# Patient Record
Sex: Female | Born: 1970 | Race: White | Hispanic: No | Marital: Married
Health system: Southern US, Community
[De-identification: ages and names within clinical notes are randomized; demographics above are authoritative.]

---

## 1999-03-18 ENCOUNTER — Other Ambulatory Visit: Admission: RE | Admit: 1999-03-18 | Discharge: 1999-03-18 | Payer: Self-pay | Admitting: Obstetrics and Gynecology

## 1999-07-16 ENCOUNTER — Encounter: Admission: RE | Admit: 1999-07-16 | Discharge: 1999-10-14 | Payer: Self-pay | Admitting: Obstetrics & Gynecology

## 1999-10-22 ENCOUNTER — Inpatient Hospital Stay (HOSPITAL_COMMUNITY): Admission: AD | Admit: 1999-10-22 | Discharge: 1999-10-24 | Payer: Self-pay | Admitting: Obstetrics & Gynecology

## 1999-10-24 ENCOUNTER — Ambulatory Visit: Admission: RE | Admit: 1999-10-24 | Discharge: 1999-10-24 | Payer: Self-pay | Admitting: Obstetrics and Gynecology

## 1999-11-26 ENCOUNTER — Other Ambulatory Visit: Admission: RE | Admit: 1999-11-26 | Discharge: 1999-11-26 | Payer: Self-pay | Admitting: Obstetrics and Gynecology

## 2017-09-30 ENCOUNTER — Emergency Department: Payer: Medicaid Other

## 2017-09-30 ENCOUNTER — Inpatient Hospital Stay
Admission: EM | Admit: 2017-09-30 | Discharge: 2017-10-07 | DRG: 853 | Disposition: A | Discharge status: Home Health | Payer: Medicaid Other | Attending: Internal Medicine | Admitting: Internal Medicine

## 2017-09-30 ENCOUNTER — Other Ambulatory Visit: Payer: Self-pay

## 2017-09-30 DIAGNOSIS — I890 Lymphedema, not elsewhere classified: Secondary | ICD-10-CM | POA: Diagnosis present

## 2017-09-30 DIAGNOSIS — F17210 Nicotine dependence, cigarettes, uncomplicated: Secondary | ICD-10-CM | POA: Diagnosis present

## 2017-09-30 DIAGNOSIS — L03115 Cellulitis of right lower limb: Secondary | ICD-10-CM | POA: Diagnosis present

## 2017-09-30 DIAGNOSIS — I872 Venous insufficiency (chronic) (peripheral): Secondary | ICD-10-CM | POA: Diagnosis present

## 2017-09-30 DIAGNOSIS — K117 Disturbances of salivary secretion: Secondary | ICD-10-CM | POA: Diagnosis present

## 2017-09-30 DIAGNOSIS — M868X7 Other osteomyelitis, ankle and foot: Secondary | ICD-10-CM | POA: Diagnosis present

## 2017-09-30 DIAGNOSIS — Z6841 Body Mass Index (BMI) 40.0 and over, adult: Secondary | ICD-10-CM | POA: Diagnosis not present

## 2017-09-30 DIAGNOSIS — K029 Dental caries, unspecified: Secondary | ICD-10-CM | POA: Diagnosis present

## 2017-09-30 DIAGNOSIS — I96 Gangrene, not elsewhere classified: Secondary | ICD-10-CM | POA: Diagnosis present

## 2017-09-30 DIAGNOSIS — K047 Periapical abscess without sinus: Secondary | ICD-10-CM | POA: Diagnosis not present

## 2017-09-30 DIAGNOSIS — I878 Other specified disorders of veins: Secondary | ICD-10-CM | POA: Diagnosis present

## 2017-09-30 DIAGNOSIS — R531 Weakness: Secondary | ICD-10-CM | POA: Diagnosis present

## 2017-09-30 DIAGNOSIS — D509 Iron deficiency anemia, unspecified: Secondary | ICD-10-CM | POA: Diagnosis present

## 2017-09-30 DIAGNOSIS — A419 Sepsis, unspecified organism: Principal | ICD-10-CM | POA: Diagnosis present

## 2017-09-30 DIAGNOSIS — L89614 Pressure ulcer of right heel, stage 4: Secondary | ICD-10-CM | POA: Diagnosis present

## 2017-09-30 DIAGNOSIS — R739 Hyperglycemia, unspecified: Secondary | ICD-10-CM | POA: Diagnosis present

## 2017-09-30 LAB — CBC WITH DIFFERENTIAL/PLATELET
Basophils Absolute: 0 10*3/uL (ref 0–0.1)
Basophils Relative: 0 %
EOS ABS: 0.1 10*3/uL (ref 0–0.7)
EOS PCT: 0 %
HCT: 30.2 % — ABNORMAL LOW (ref 35.0–47.0)
Hemoglobin: 9.5 g/dL — ABNORMAL LOW (ref 12.0–16.0)
LYMPHS ABS: 1.6 10*3/uL (ref 1.0–3.6)
Lymphocytes Relative: 11 %
MCH: 17.2 pg — AB (ref 26.0–34.0)
MCHC: 31.5 g/dL — ABNORMAL LOW (ref 32.0–36.0)
MCV: 54.7 fL — ABNORMAL LOW (ref 80.0–100.0)
MONO ABS: 0.7 10*3/uL (ref 0.2–0.9)
Monocytes Relative: 5 %
Neutro Abs: 11.4 10*3/uL — ABNORMAL HIGH (ref 1.4–6.5)
Neutrophils Relative %: 84 %
PLATELETS: 289 10*3/uL (ref 150–440)
RBC: 5.51 MIL/uL — AB (ref 3.80–5.20)
RDW: 21.5 % — AB (ref 11.5–14.5)
WBC: 13.8 10*3/uL — ABNORMAL HIGH (ref 3.6–11.0)

## 2017-09-30 LAB — COMPREHENSIVE METABOLIC PANEL
ALT: 35 U/L (ref 14–54)
ANION GAP: 9 (ref 5–15)
AST: 45 U/L — ABNORMAL HIGH (ref 15–41)
Albumin: 2.7 g/dL — ABNORMAL LOW (ref 3.5–5.0)
Alkaline Phosphatase: 197 U/L — ABNORMAL HIGH (ref 38–126)
BUN: 22 mg/dL — ABNORMAL HIGH (ref 6–20)
CHLORIDE: 98 mmol/L — AB (ref 101–111)
CO2: 28 mmol/L (ref 22–32)
Calcium: 9.6 mg/dL (ref 8.9–10.3)
Creatinine, Ser: 0.81 mg/dL (ref 0.44–1.00)
Glucose, Bld: 144 mg/dL — ABNORMAL HIGH (ref 65–99)
POTASSIUM: 4 mmol/L (ref 3.5–5.1)
SODIUM: 135 mmol/L (ref 135–145)
Total Bilirubin: 0.3 mg/dL (ref 0.3–1.2)
Total Protein: 8.6 g/dL — ABNORMAL HIGH (ref 6.5–8.1)

## 2017-09-30 LAB — LACTIC ACID, PLASMA
LACTIC ACID, VENOUS: 2.1 mmol/L — AB (ref 0.5–1.9)
LACTIC ACID, VENOUS: 0.9 mmol/L (ref 0.5–1.9)

## 2017-09-30 LAB — TSH: TSH: 1.375 u[IU]/mL (ref 0.350–4.500)

## 2017-09-30 LAB — GLUCOSE, CAPILLARY: Glucose-Capillary: 133 mg/dL — ABNORMAL HIGH (ref 65–99)

## 2017-09-30 MED ORDER — ACETAMINOPHEN 650 MG RE SUPP: 650.0000 mg | Freq: Four times a day (QID) | RECTAL | Status: DC | PRN

## 2017-09-30 MED ORDER — ACETAMINOPHEN 325 MG PO TABS
650.0000 mg | ORAL_TABLET | Freq: Four times a day (QID) | ORAL | Status: DC | PRN
  Filled 2017-09-30: qty 2

## 2017-09-30 MED ORDER — BISACODYL 10 MG RE SUPP: 10.0000 mg | Freq: Every day | RECTAL | Status: DC | PRN

## 2017-09-30 MED ORDER — VANCOMYCIN HCL 10 G IV SOLR
1500.0000 mg | Freq: Three times a day (TID) | INTRAVENOUS | Status: DC
  Administered 2017-09-30 – 2017-10-01 (×2): 1500 mg via INTRAVENOUS
  Filled 2017-09-30 (×4): qty 1500

## 2017-09-30 MED ORDER — VANCOMYCIN HCL IN DEXTROSE 1-5 GM/200ML-% IV SOLN
1000.0000 mg | Freq: Once | INTRAVENOUS | Status: AC
  Administered 2017-09-30: 1000 mg via INTRAVENOUS
  Filled 2017-09-30: qty 200

## 2017-09-30 MED ORDER — INSULIN ASPART 100 UNIT/ML ~~LOC~~ SOLN
0.0000 [IU] | Freq: Three times a day (TID) | SUBCUTANEOUS | Status: DC
  Administered 2017-10-02 – 2017-10-03 (×2): 1 [IU] via SUBCUTANEOUS
  Filled 2017-09-30 (×3): qty 1

## 2017-09-30 MED ORDER — PIPERACILLIN-TAZOBACTAM 3.375 G IVPB 30 MIN
3.3750 g | Freq: Once | INTRAVENOUS | Status: AC
  Administered 2017-09-30: 3.375 g via INTRAVENOUS
  Filled 2017-09-30: qty 50

## 2017-09-30 MED ORDER — SODIUM CHLORIDE 0.9 % IV SOLN
Freq: Once | INTRAVENOUS | Status: AC
  Administered 2017-09-30: 21:00:00 via INTRAVENOUS

## 2017-09-30 MED ORDER — PIPERACILLIN-TAZOBACTAM 3.375 G IVPB
3.3750 g | Freq: Three times a day (TID) | INTRAVENOUS | Status: DC
  Administered 2017-10-01 – 2017-10-05 (×13): 3.375 g via INTRAVENOUS
  Filled 2017-09-30 (×13): qty 50

## 2017-09-30 MED ORDER — TRAMADOL HCL 50 MG PO TABS
50.0000 mg | ORAL_TABLET | Freq: Four times a day (QID) | ORAL | Status: DC | PRN
  Administered 2017-09-30: 50 mg via ORAL
  Filled 2017-09-30: qty 1

## 2017-09-30 MED ORDER — POLYETHYLENE GLYCOL 3350 17 G PO PACK: 17.0000 g | PACK | Freq: Every day | ORAL | Status: DC | PRN

## 2017-09-30 MED ORDER — ENOXAPARIN SODIUM 40 MG/0.4ML ~~LOC~~ SOLN
40.0000 mg | Freq: Two times a day (BID) | SUBCUTANEOUS | Status: DC
  Administered 2017-09-30 – 2017-10-06 (×12): 40 mg via SUBCUTANEOUS
  Filled 2017-09-30 (×12): qty 0.4

## 2017-09-30 MED ORDER — SODIUM CHLORIDE 0.9 % IV BOLUS (SEPSIS)
1000.0000 mL | Freq: Once | INTRAVENOUS | Status: AC
  Administered 2017-09-30: 1000 mL via INTRAVENOUS

## 2017-09-30 NOTE — ED Notes (Signed)
Dr. Lenard LancePaduchowski made aware of critical lactic value of 2.1

## 2017-09-30 NOTE — ED Provider Notes (Signed)
[facility] Emergency Department Provider Note  Time seen: 5:14 PM  I have reviewed the triage vital signs and the nursing notes.   HISTORY  Chief Complaint Leg Pain    HPI Charlene Jackson is a 47 y.o. female with a past medical history of stasis dermatitis, who presents to the emergency department for worsening right foot pain, worsening lower extremity swelling.  According to the patient it has been approximate 1 year since she is followed up with any physician for her stasis dermatitis is no longer taking any medications.  Patient states at baseline she has swollen legs with plaque/brown skin that accumulates on her feet.  Patient states she often times will get in the bath and is able to remove the excess skin.  She states for the past 1 week she has not been able to do that due to progressively worsening pain especially in the right foot.  Today she noted pus and blood coming from the right foot which is what prompted the ER visit.  Patient states she has not been able to ambulate for the past 2 days at all due to pain especially in the right foot.  Patient denies any known fever, largely negative review of systems otherwise.  Past medical history: With stasis dermatitis  Medications: None  Allergies: None  Social History Social History   Tobacco Use  . Smoking status: Not on file  Substance Use Topics  . Alcohol use: Not on file  . Drug use: Not on file    Review of Systems Constitutional: Negative for fever. Eyes: Negative for visual complaints ENT: Negative for congestion Cardiovascular: Negative for chest pain. Respiratory: Negative for shortness of breath. Gastrointestinal: Negative for abdominal pain, vomiting  Genitourinary: Negative for dysuria.  Musculoskeletal: Lower extremity swelling bilateral pain Skin: Increased pain swelling plaques to bilateral feet Neurological: Negative for headache All other ROS  negative  ____________________________________________   PHYSICAL EXAM:  VITAL SIGNS: ED Triage Vitals  Enc Vitals Group     BP 09/30/17 1632 118/75     Pulse Rate 09/30/17 1632 93     Resp 09/30/17 1632 16     Temp --      Temp src --      SpO2 09/30/17 1632 98 %     Weight 09/30/17 1633 280 lb (127 kg)     Height 09/30/17 1633 5\' 7"  (1.702 m)     Head Circumference --      Peak Flow --      Pain Score 09/30/17 1632 10     Pain Loc --      Pain Edu? --      Excl. in GC? --    Constitutional: Alert and oriented. Well appearing and in no distress. Eyes: Normal exam ENT   Head: Normocephalic and atraumatic.   Mouth/Throat: Mucous membranes are moist. Cardiovascular: Normal rate, regular rhythm. No murmur Respiratory: Normal respiratory effort without tachypnea nor retractions. Breath sounds are clear Gastrointestinal: Soft and nontender. No distention.  Musculoskeletal: 3+ lower extremity edema equal bilaterally.  Patient has impressive skin/plaque buildup to bilateral feet.  On her right heel she has a large area/wound with slight blood/pus discharge.  Appears consistent with a necrotic wound. Neurologic:  Normal speech and language. No gross focal neurologic deficits  Skin:  Skin is warm, swelling, redness, pus and blood drainage from right foot.  Impressive excessive skin/plaque buildup to bilateral feet. Psychiatric: Mood and affect are normal.  ____________________________________________  RADIOLOGY  x-rays are negative for osteomyelitis.  ____________________________________________   INITIAL IMPRESSION / ASSESSMENT AND PLAN / ED COURSE  Pertinent labs & imaging results that were available during my care of the patient were reviewed by me and considered in my medical decision making (see chart for details).  Presents to the emergency department for bilateral foot pain right greater than left now with pus and blood drainage.  Differential would include  infection, ulceration, necrotic wound, cellulitis, osteomyelitis.  We will check labs, cultures, start broad-spectrum antibiotics.  Will obtain x-ray imaging to rule out gas forming organisms.  Patient will require admission to the hospital for further treatment.  Records reviewed, no old records available at this time  labs show an elevated lactic acid as well as white blood cell count. X-rays are negative. Given the elevated white blood cell count and initial elevated heart rate and activated sepsis protocols. Patient is already receiving broad-spectrum antibiotics. Will admit to the hospitalist service for further treatment.   ____________________________________________   FINAL CLINICAL IMPRESSION(S) / ED DIAGNOSES  Cellulitis    Minna Antis, MD 09/30/17 1910

## 2017-09-30 NOTE — H&P (Addendum)
[facility] - [place] at [facility]   PATIENT NAME: Charlene Jackson    MR#:  [id number]  DATE OF BIRTH:  [birth year]  DATE OF ADMISSION:  09/30/2017  PRIMARY CARE PHYSICIAN: Patient, No Pcp Per   REQUESTING/REFERRING PHYSICIAN: Dr. Lenard Lance  CHIEF COMPLAINT:  Bilateral severe lower extremity infection with drainage from both feet foul-smelling for several months HISTORY OF PRESENT ILLNESS:  Pearlie Nies  is a 47 y.o. female with past medical history of stasis dermatitis both lower extremity presents to the emergency room department for worsening right foot pain and worsening bilateral lower extremity swelling with fungating skin infection along with significant amount of drainage from the right heel.  Patient says at baseline her legs have been swollen with black/brown scaly skin that he relates on her feet falls off on its own.  She used to follow-up with a dermatologist however has lost to do so in many months. She states for the past 1 week she has not been able to do so progressively worsening pain in the right foot.  She noted pus and blood coming from the right heel which prompted her to come to the ER.  She does not take any medications. She has elevated white count lactic acid 2.1.  Given severe infection she is started on IV vancomycin and Zosyn.  Patient is being admitted for further vaginal management with sepsis secondary to severe cellulitis of the setting of severe stasis dermatitis both lower extremities.  PAST MEDICAL HISTORY:  Stasis dermatitis  PAST SURGICAL HISTOIRY:  none  SOCIAL HISTORY:   Social History   Tobacco Use  . Smoking status: Not on file  Substance Use Topics  . Alcohol use: Not on file    FAMILY HISTORY:  No family history on file.  DRUG ALLERGIES:  Not on File  REVIEW OF SYSTEMS:  Review of Systems  Constitutional: Negative for chills, fever and weight loss.  HENT: Negative for ear discharge, ear pain and  nosebleeds.   Eyes: Negative for blurred vision, pain and discharge.  Respiratory: Negative for sputum production, shortness of breath, wheezing and stridor.   Cardiovascular: Negative for chest pain, palpitations, orthopnea and PND.  Gastrointestinal: Negative for abdominal pain, diarrhea, nausea and vomiting.  Genitourinary: Negative for frequency and urgency.  Musculoskeletal: Positive for joint pain. Negative for back pain.  Skin:       Severe stasis dermatitis and severe cellulitis with fungating lesions in both lower feet  Neurological: Positive for weakness. Negative for sensory change, speech change and focal weakness.  Psychiatric/Behavioral: Negative for depression and hallucinations. The patient is not nervous/anxious.      MEDICATIONS AT HOME:   Prior to Admission medications   Not on File      VITAL SIGNS:  Blood pressure (!) 117/59, pulse 87, resp. rate 16, height 5\' 7"  (1.702 m), weight 127 kg (280 lb), SpO2 99 %.  PHYSICAL EXAMINATION:  GENERAL:  47 y.o.-year-old patient lying in the bed with no acute distress.  Obese EYES: Pupils equal, round, reactive to light and accommodation. No scleral icterus. Extraocular muscles intact.  HEENT: Head atraumatic, normocephalic. Oropharynx and nasopharynx clear.  NECK:  Supple, no jugular venous distention. No thyroid enlargement, no tenderness.  LUNGS: Normal breath sounds bilaterally, no wheezing, rales,rhonchi or crepitation. No use of accessory muscles of respiration.  CARDIOVASCULAR: S1, S2 normal. No murmurs, rubs, or gallops.  ABDOMEN: Soft, nontender, nondistended. Bowel sounds present. No organomegaly or mass.  EXTREMITIES: Skin is warm there  is swelling redness and pus with blood drainage from the right foot.  She has impressive excessive skin/plaque buildup to bilateral feet and up to the mid tibial sin NEUROLOGIC: Grossly intact unable to assess secondary to patient's severe body posture. pSYCHIATRIC: The patient is  alert and oriented x 3.  SKIN: No obvious rash, lesion, or ulcer.   LABORATORY PANEL:   CBC Recent Labs  Lab 09/30/17 1658  WBC 13.8*  HGB 9.5*  HCT 30.2*  PLT 289   ------------------------------------------------------------------------------------------------------------------  Chemistries  Recent Labs  Lab 09/30/17 1658  NA 135  K 4.0  CL 98*  CO2 28  GLUCOSE 144*  BUN 22*  CREATININE 0.81  CALCIUM 9.6  AST 45*  ALT 35  ALKPHOS 197*  BILITOT 0.3   ------------------------------------------------------------------------------------------------------------------  Cardiac Enzymes No results for input(s): TROPONINI in the last 168 hours. ------------------------------------------------------------------------------------------------------------------  RADIOLOGY:  Dg Ankle Complete Left  Result Date: 09/30/2017 CLINICAL DATA:  Bilateral leg pain progressively worsened over the past few months. Stasis dermatitis. Open laceration to bottom of right foot. EXAM: LEFT ANKLE COMPLETE - 3+ VIEW COMPARISON:  None. FINDINGS: Evaluation of osseous detail is limited by body habitus. Bone mineralization is grossly normal. Alignment appears normal. No acute or suspicious osseous lesion identified. No focal soft tissue abnormality seen. IMPRESSION: No acute findings.  Study limitations detailed above. Electronically Signed   By: Bary Richard M.D.   On: 09/30/2017 18:05   Dg Ankle Complete Right  Result Date: 09/30/2017 CLINICAL DATA:  Bilateral leg pain that has progressively worsened over the past few months. Pt reports she has stasis dermatitis. Pt has an open laceration to the bottom right foot. EXAM: RIGHT ANKLE - COMPLETE 3+ VIEW COMPARISON:  None. FINDINGS: Evaluation of osseous detail is limited by patient body habitus. Bone mineralization is grossly normal. Osseous alignment appears normal. No acute or suspicious osseous lesions seen. No focal soft tissue abnormality identified.  Subtle lucency underlying the calcaneus may be related to the given history of laceration. IMPRESSION: No acute osseous abnormality identified. Study limitations detailed above. Lucency underlying the calcaneus may related to the given history of laceration. Electronically Signed   By: Bary Richard M.D.   On: 09/30/2017 18:06    EKG:    IMPRESSION AND PLAN:   Christella App  is a 47 y.o. female with past medical history of stasis dermatitis both lower extremity presents to the emergency room department for worsening right foot pain and worsening bilateral lower extremity swelling with fungating skin infection along with significant amount of drainage from the right heel.  Patient says at baseline her legs have been swollen with black/brown scaly skin that he relates on her feet falls off on its own.  She used to follow-up with a dermatologist however has lost to do so in many months.  1.  Sepsis secondary to bilateral lower extremity cellulitis with significant wound infection foul-smelling right foot more than the left -Patient has severe amount of drainage from the right heel. -Admit to medical floor -IV Vanco and Zosyn -Podiaty, surgery and vascular consultation -Wound consult -X-ray both feet no evidence of osteomyelitis noted on plain x-ray film  2.  Hyperglycemia Check A1c Sliding scale insulin  3.  Patient states she has history of thyroid problem -We will check TSH  4.  DVT prophylaxis subcu Lovenox  All the records are reviewed and case discussed with ED provider. Management plans discussed with the patient, family and they are in agreement.  CODE STATUS:  full  TOTAL TIME TAKING CARE OF THIS PATIENT: 45 minutes.    Enedina FinnerSona Zeanna Sunde M.D on 09/30/2017 at 7:26 PM  Between 7am to 6pm - Pager - [phone]  After 6pm go to [email or web address] - password [email or web address] [facility]  [facility]  Office  [phone]  CC: Primary care physician; Patient, No Pcp Per

## 2017-09-30 NOTE — Progress Notes (Signed)
Pharmacy Antibiotic Note  Charlene PiesLisa R Jackson is a 47 y.o. female admitted on 09/30/2017 with wound infection.  Pharmacy has been consulted for vancomycin and zosyn dosing.  Plan: Vancomycin 1500mg  IV every 8 hours.  Goal trough 15-20 mcg/mL. Zosyn 3.375g IV q8h (4 hour infusion).  Height: 5\' 7"  (170.2 cm) Weight: 280 lb (127 kg) IBW/kg (Calculated) : 61.6  No data recorded.  Recent Labs  Lab 09/30/17 1658 09/30/17 1732  WBC 13.8*  --   CREATININE 0.81  --   LATICACIDVEN  --  2.1*    Estimated Creatinine Clearance: 117.7 mL/min (by C-G formula based on SCr of 0.81 mg/dL).    Not on File  Antimicrobials this admission: Anti-infectives (From admission, onward)   Start     Dose/Rate Route Frequency Ordered Stop   10/01/17 0400  piperacillin-tazobactam (ZOSYN) IVPB 3.375 g     3.375 g 12.5 mL/hr over 240 Minutes Intravenous Every 8 hours 09/30/17 1933     09/30/17 2200  vancomycin (VANCOCIN) 1,500 mg in sodium chloride 0.9 % 500 mL IVPB     1,500 mg 250 mL/hr over 120 Minutes Intravenous Every 8 hours 09/30/17 1939     09/30/17 1715  vancomycin (VANCOCIN) IVPB 1000 mg/200 mL premix     1,000 mg 200 mL/hr over 60 Minutes Intravenous  Once 09/30/17 1714 09/30/17 1905   09/30/17 1715  piperacillin-tazobactam (ZOSYN) IVPB 3.375 g     3.375 g 100 mL/hr over 30 Minutes Intravenous  Once 09/30/17 1714 09/30/17 1825      Microbiology results: No results found for this or any previous visit (from the past 240 hour(s)).  Thank you for allowing pharmacy to be a part of this patient's care.  Charlene Jackson 09/30/2017 7:40 PM

## 2017-09-30 NOTE — ED Triage Notes (Signed)
Pt arrived via EMS from home d/t leg pain tat has progressively worsened over the past few months. Pt reports she has stasis dermatitis. Pt has an open laceration to the bottom right foot. Pt denies any other medical hx. Pt is A&O x 4 at this time.

## 2017-09-30 NOTE — Consult Note (Signed)
Reason for Consult: Ulceration right heel Referring Physician: [name] is an 47 y.o. female.  HPI: This is a 47 year old female with a complaint of chronic stasis dermatitis in both of her legs. She had been seen last year by dermatology who recommended follow-up with her primary care which was never established. Recently about a month ago noticed a sore on her right heel that has progressed. He is noticed some increased drainage recently and presented to the emergency department for evaluation.  History reviewed. No pertinent past medical history.  History reviewed. No pertinent surgical history.  History reviewed. No pertinent family history.  Social History:  reports that she quit smoking yesterday. she has never used smokeless tobacco. Her alcohol and drug histories are not on file.  Allergies: Not on File  Medications:  Scheduled: . enoxaparin (LOVENOX) injection  40 mg Subcutaneous Q12H  . [START ON 10/01/2017] insulin aspart  0-9 Units Subcutaneous TID WC    Results for orders placed or performed during the hospital encounter of 09/30/17 (from the past 48 hour(s))  CBC with Differential     Status: Abnormal   Collection Time: 09/30/17  4:58 PM  Result Value Ref Range   WBC 13.8 (H) 3.6 - 11.0 K/uL   RBC 5.51 (H) 3.80 - 5.20 MIL/uL   Hemoglobin 9.5 (L) 12.0 - 16.0 g/dL   HCT 30.2 (L) 35.0 - 47.0 %   MCV 54.7 (L) 80.0 - 100.0 fL   MCH 17.2 (L) 26.0 - 34.0 pg   MCHC 31.5 (L) 32.0 - 36.0 g/dL   RDW 21.5 (H) 11.5 - 14.5 %   Platelets 289 150 - 440 K/uL   Neutrophils Relative % 84 %   Neutro Abs 11.4 (H) 1.4 - 6.5 K/uL   Lymphocytes Relative 11 %   Lymphs Abs 1.6 1.0 - 3.6 K/uL   Monocytes Relative 5 %   Monocytes Absolute 0.7 0.2 - 0.9 K/uL   Eosinophils Relative 0 %   Eosinophils Absolute 0.1 0 - 0.7 K/uL   Basophils Relative 0 %   Basophils Absolute 0.0 0 - 0.1 K/uL    Comment: Performed at [facility], [address] [place], [place]  [address]  Comprehensive metabolic panel     Status: Abnormal   Collection Time: 09/30/17  4:58 PM  Result Value Ref Range   Sodium 135 135 - 145 mmol/L   Potassium 4.0 3.5 - 5.1 mmol/L   Chloride 98 (L) 101 - 111 mmol/L   CO2 28 22 - 32 mmol/L   Glucose, Bld 144 (H) 65 - 99 mg/dL   BUN 22 (H) 6 - 20 mg/dL   Creatinine, Ser 0.81 0.44 - 1.00 mg/dL   Calcium 9.6 8.9 - 10.3 mg/dL   Total Protein 8.6 (H) 6.5 - 8.1 g/dL   Albumin 2.7 (L) 3.5 - 5.0 g/dL   AST 45 (H) 15 - 41 U/L   ALT 35 14 - 54 U/L   Alkaline Phosphatase 197 (H) 38 - 126 U/L   Total Bilirubin 0.3 0.3 - 1.2 mg/dL   GFR calc non Af Amer >60 >60 mL/min   GFR calc Af Amer >60 >60 mL/min    Comment: (NOTE) The eGFR has been calculated using the CKD EPI equation. This calculation has not been validated in all clinical situations. eGFR's persistently <60 mL/min signify possible Chronic Kidney Disease.    Anion gap 9 5 - 15    Comment: Performed at [facility], [address] [place], [place] [address]  TSH     Status: None   Collection Time: 09/30/17  4:58 PM  Result Value Ref Range   TSH 1.375 0.350 - 4.500 uIU/mL    Comment: Performed by a 3rd Generation assay with a functional sensitivity of <=0.01 uIU/mL. Performed at [facility], [address] [place], [place] [address]   Lactic acid, plasma     Status: Abnormal   Collection Time: 09/30/17  5:32 PM  Result Value Ref Range   Lactic Acid, Venous 2.1 (HH) 0.5 - 1.9 mmol/L    Comment: CRITICAL RESULT CALLED TO, READ BACK BY AND VERIFIED WITH DAJEA SCOTT AT 1817 09/30/2017 BY TFK. Performed at [facility], [address] [place], [place] [address]   Lactic acid, plasma     Status: None   Collection Time: 09/30/17  9:50 PM  Result Value Ref Range   Lactic Acid, Venous 0.9 0.5 - 1.9 mmol/L    Comment: Performed at [facility], [address] [place],  [address]    Dg Ankle Complete Left  Result Date:  09/30/2017 CLINICAL DATA:  Bilateral leg pain progressively worsened over the past few months. Stasis dermatitis. Open laceration to bottom of right foot. EXAM: LEFT ANKLE COMPLETE - 3+ VIEW COMPARISON:  None. FINDINGS: Evaluation of osseous detail is limited by body habitus. Bone mineralization is grossly normal. Alignment appears normal. No acute or suspicious osseous lesion identified. No focal soft tissue abnormality seen. IMPRESSION: No acute findings.  Study limitations detailed above. Electronically Signed   By: Franki Cabot M.D.   On: 09/30/2017 18:05   Dg Ankle Complete Right  Result Date: 09/30/2017 CLINICAL DATA:  Bilateral leg pain that has progressively worsened over the past few months. Pt reports she has stasis dermatitis. Pt has an open laceration to the bottom right foot. EXAM: RIGHT ANKLE - COMPLETE 3+ VIEW COMPARISON:  None. FINDINGS: Evaluation of osseous detail is limited by patient body habitus. Bone mineralization is grossly normal. Osseous alignment appears normal. No acute or suspicious osseous lesions seen. No focal soft tissue abnormality identified. Subtle lucency underlying the calcaneus may be related to the given history of laceration. IMPRESSION: No acute osseous abnormality identified. Study limitations detailed above. Lucency underlying the calcaneus may related to the given history of laceration. Electronically Signed   By: Franki Cabot M.D.   On: 09/30/2017 18:06    Review of Systems  Constitutional: Negative for chills and fever.  HENT: Negative.   Eyes: Negative.   Respiratory: Negative.   Cardiovascular: Positive for leg swelling.  Gastrointestinal: Negative for nausea and vomiting.  Genitourinary: Negative.   Musculoskeletal:       Relates pain in the right leg with difficulty moving the leg  Skin:       The patient relates chronic stasis dermatitis with both legs. Relates a sore on the bottom of her right heel for the past month with some recent increased  drainage.  Neurological: Negative.   Endo/Heme/Allergies: Negative.   Psychiatric/Behavioral: Negative.    Blood pressure 122/60, pulse 95, resp. rate 20, height 5' 7"  (1.702 m), weight 127 kg (280 lb), SpO2 97 %. Physical Exam  Cardiovascular:  DP and PT pulses could not clearly be palpated but this may be related to edema.  Musculoskeletal:  Difficulty trying to perform range of motion in the pedal joints. Significant pain on any attempted motion in the right foot with severe pain around the heel.  Neurological:  Protective threshold monofilament wire appears to be grossly intact and symmetric to the digits bilateral.  Skin:  Severe edema with fungated stasis dermatitis bilateral. Full-thickness ulceration with boggy appearance on the plantar aspect of the right heel measuring at least 5 x 7 cm with undetermined depth. Bone.    Assessment/Plan: Assessment: Full-thickness ulceration right heel with gangrenous changes  Plan: Excisional debridement of devitalized tissue superficially from the ulceration on the right heel as well as a small area of full thickness including some of the deeper subcutaneous tissue using a pair of tissue nippers. A wet to dry dressing was applied to the right heel. Discussed with the patient that she will need surgical debridement of the devitalized infected tissue. We will also wait to hear from vascular surgery as far as timing for debridement as she may also need some type of vascular evaluation. Due to her condition and the location of the ulceration we did discuss that there would be an extended recovery time involved and that if the ulceration does progressed down to the bone that there is a risk for lower extremity amputation. I would like to try to obtain an MRI of the right ankle for evaluation of the extent of infection and possible bone involvement. Plan for this tomorrow and then try to coordinate as far as debridement in the near future.  Durward Fortes 09/30/2017, 11:05 PM

## 2017-09-30 NOTE — Progress Notes (Signed)
Anticoagulation monitoring(Lovenox):  47 yo female ordered Lovenox 40 mg Q24h  Filed Weights   09/30/17 1633  Weight: 280 lb (127 kg)   BMI 43.8    Lab Results  Component Value Date   CREATININE 0.81 09/30/2017   Estimated Creatinine Clearance: 117.7 mL/min (by C-G formula based on SCr of 0.81 mg/dL). Hemoglobin & Hematocrit     Component Value Date/Time   HGB 9.5 (L) 09/30/2017 1658   HCT 30.2 (L) 09/30/2017 1658     Per Protocol for Patient with estCrcl > 30 ml/min and BMI > 40, will transition to Lovenox 40 mg Q12h.

## 2017-10-01 ENCOUNTER — Encounter
Admission: EM | Disposition: A | Discharge status: Home Health | Payer: Self-pay | Source: Home / Self Care | Attending: Internal Medicine

## 2017-10-01 ENCOUNTER — Inpatient Hospital Stay: Payer: Medicaid Other

## 2017-10-01 DIAGNOSIS — M79604 Pain in right leg: Secondary | ICD-10-CM

## 2017-10-01 DIAGNOSIS — R600 Localized edema: Secondary | ICD-10-CM

## 2017-10-01 DIAGNOSIS — F17200 Nicotine dependence, unspecified, uncomplicated: Secondary | ICD-10-CM

## 2017-10-01 DIAGNOSIS — L89619 Pressure ulcer of right heel, unspecified stage: Secondary | ICD-10-CM

## 2017-10-01 HISTORY — PX: LOWER EXTREMITY ANGIOGRAPHY: CATH118251

## 2017-10-01 LAB — URINALYSIS, ROUTINE W REFLEX MICROSCOPIC
BILIRUBIN URINE: NEGATIVE
Bacteria, UA: NONE SEEN
GLUCOSE, UA: NEGATIVE mg/dL
Ketones, ur: NEGATIVE mg/dL
NITRITE: NEGATIVE
PH: 5 (ref 5.0–8.0)
Protein, ur: NEGATIVE mg/dL
SPECIFIC GRAVITY, URINE: 1.018 (ref 1.005–1.030)

## 2017-10-01 LAB — BLOOD CULTURE ID PANEL (REFLEXED)
Acinetobacter baumannii: NOT DETECTED
CANDIDA GLABRATA: NOT DETECTED
CANDIDA KRUSEI: NOT DETECTED
CANDIDA PARAPSILOSIS: NOT DETECTED
CANDIDA TROPICALIS: NOT DETECTED
Candida albicans: NOT DETECTED
ENTEROBACTER CLOACAE COMPLEX: NOT DETECTED
ENTEROBACTERIACEAE SPECIES: NOT DETECTED
Enterococcus species: NOT DETECTED
Escherichia coli: NOT DETECTED
Haemophilus influenzae: NOT DETECTED
KLEBSIELLA OXYTOCA: NOT DETECTED
KLEBSIELLA PNEUMONIAE: NOT DETECTED
Listeria monocytogenes: NOT DETECTED
Methicillin resistance: NOT DETECTED
Neisseria meningitidis: NOT DETECTED
Proteus species: NOT DETECTED
Pseudomonas aeruginosa: NOT DETECTED
STREPTOCOCCUS PYOGENES: NOT DETECTED
Serratia marcescens: NOT DETECTED
Staphylococcus aureus (BCID): NOT DETECTED
Staphylococcus species: DETECTED — AB
Streptococcus agalactiae: NOT DETECTED
Streptococcus pneumoniae: NOT DETECTED
Streptococcus species: NOT DETECTED

## 2017-10-01 LAB — GLUCOSE, CAPILLARY
GLUCOSE-CAPILLARY: 92 mg/dL (ref 65–99)
Glucose-Capillary: 90 mg/dL (ref 65–99)
Glucose-Capillary: 78 mg/dL (ref 65–99)
Glucose-Capillary: 85 mg/dL (ref 65–99)
Glucose-Capillary: 119 mg/dL — ABNORMAL HIGH (ref 65–99)

## 2017-10-01 LAB — HEMOGLOBIN A1C
HEMOGLOBIN A1C: 5.4 % (ref 4.8–5.6)
Mean Plasma Glucose: 108.28 mg/dL

## 2017-10-01 SURGERY — LOWER EXTREMITY ANGIOGRAPHY
Anesthesia: Moderate Sedation | Laterality: Right

## 2017-10-01 SURGERY — LOWER EXTREMITY ANGIOGRAPHY
Anesthesia: Moderate Sedation

## 2017-10-01 MED ORDER — FENTANYL CITRATE (PF) 100 MCG/2ML IJ SOLN
INTRAMUSCULAR | Status: AC
  Filled 2017-10-01: qty 2

## 2017-10-01 MED ORDER — FENTANYL CITRATE (PF) 100 MCG/2ML IJ SOLN
INTRAMUSCULAR | Status: DC | PRN
  Administered 2017-10-01 (×2): 50 ug via INTRAVENOUS

## 2017-10-01 MED ORDER — HEPARIN (PORCINE) IN NACL 2-0.9 UNIT/ML-% IJ SOLN
INTRAMUSCULAR | Status: AC
  Filled 2017-10-01: qty 1000

## 2017-10-01 MED ORDER — IOPAMIDOL (ISOVUE-300) INJECTION 61%
INTRAVENOUS | Status: DC | PRN
  Administered 2017-10-01: 30 mL via INTRAVENOUS

## 2017-10-01 MED ORDER — HEPARIN SODIUM (PORCINE) 1000 UNIT/ML IJ SOLN
INTRAMUSCULAR | Status: AC
  Filled 2017-10-01: qty 1

## 2017-10-01 MED ORDER — MIDAZOLAM HCL 2 MG/2ML IJ SOLN
INTRAMUSCULAR | Status: AC
  Filled 2017-10-01: qty 2

## 2017-10-01 MED ORDER — MIDAZOLAM HCL 2 MG/2ML IJ SOLN
INTRAMUSCULAR | Status: DC | PRN
  Administered 2017-10-01 (×2): 2 mg via INTRAVENOUS

## 2017-10-01 MED ORDER — MIDAZOLAM HCL 5 MG/5ML IJ SOLN
INTRAMUSCULAR | Status: AC
  Filled 2017-10-01: qty 5

## 2017-10-01 MED ORDER — SODIUM CHLORIDE 0.9 % IV SOLN
INTRAVENOUS | Status: DC
  Administered 2017-10-01: 15:00:00 via INTRAVENOUS

## 2017-10-01 MED ORDER — CEFUROXIME SODIUM 1.5 G IV SOLR
1.5000 g | INTRAVENOUS | Status: DC
  Filled 2017-10-01: qty 1.5

## 2017-10-01 MED ORDER — LIDOCAINE-EPINEPHRINE (PF) 1 %-1:200000 IJ SOLN
INTRAMUSCULAR | Status: AC
  Filled 2017-10-01: qty 30

## 2017-10-01 MED ORDER — VANCOMYCIN HCL IN DEXTROSE 1-5 GM/200ML-% IV SOLN
1000.0000 mg | Freq: Three times a day (TID) | INTRAVENOUS | Status: DC
  Administered 2017-10-01 – 2017-10-02 (×4): 1000 mg via INTRAVENOUS
  Filled 2017-10-01 (×8): qty 200

## 2017-10-01 SURGICAL SUPPLY — 7 items
CATH PIG 70CM (CATHETERS) ×2 IMPLANT
COVER PROBE U/S 5X48 (MISCELLANEOUS) ×2 IMPLANT
DEVICE STARCLOSE SE CLOSURE (Vascular Products) ×2 IMPLANT
PACK ANGIOGRAPHY (CUSTOM PROCEDURE TRAY) ×3 IMPLANT
SHEATH BRITE TIP 5FRX11 (SHEATH) ×2 IMPLANT
TUBING CONTRAST HIGH PRESS 72 (TUBING) ×2 IMPLANT
WIRE J 3MM .035X145CM (WIRE) ×2 IMPLANT

## 2017-10-01 NOTE — Progress Notes (Signed)
[facility] [place] [facility]   PATIENT NAME: Charlene Jackson    MR#:  [id number]  DATE OF BIRTH:  09-Dec-1970  SUBJECTIVE:  CHIEF COMPLAINT:   Chief Complaint  Patient presents with  . Leg Pain   Have severe lymphedema and bilateral leg swelling, came with worsening pain and redness with some discharge from right heel. Found to have osteomyelitis on MRI.  REVIEW OF SYSTEMS:  CONSTITUTIONAL: No fever, positive for fatigue or weakness.  EYES: No blurred or double vision.  EARS, NOSE, AND THROAT: No tinnitus or ear pain.  RESPIRATORY: No cough, shortness of breath, wheezing or hemoptysis.  CARDIOVASCULAR: No chest pain, orthopnea, edema.  GASTROINTESTINAL: No nausea, vomiting, diarrhea or abdominal pain.  GENITOURINARY: No dysuria, hematuria.  ENDOCRINE: No polyuria, nocturia,  HEMATOLOGY: No anemia, easy bruising or bleeding SKIN: Chronic skin changes due to lymphedema and venous stasis on both legs. MUSCULOSKELETAL: No joint pain or arthritis.   NEUROLOGIC: No tingling, numbness, weakness.  PSYCHIATRY: No anxiety or depression.   ROS  DRUG ALLERGIES:  Not on File  VITALS:  Blood pressure (!) 110/50, pulse 92, temperature (!) 96 F (35.6 C), temperature source Rectal, resp. rate 16, height 5\' 7"  (1.702 m), weight 127 kg (280 lb), last menstrual period 10/01/2017, SpO2 99 %.  PHYSICAL EXAMINATION:   GENERAL:  47 y.o.-year-old patient lying in the bed with no acute distress.  Obese EYES: Pupils equal, round, reactive to light and accommodation. No scleral icterus. Extraocular muscles intact.  HEENT: Head atraumatic, normocephalic. Oropharynx and nasopharynx clear.  NECK:  Supple, no jugular venous distention. No thyroid enlargement, no tenderness.  LUNGS: Normal breath sounds bilaterally, no wheezing, rales,rhonchi or crepitation. No use of accessory muscles of respiration.  CARDIOVASCULAR: S1, S2 normal. No murmurs, rubs, or gallops.  ABDOMEN: Soft,  nontender, nondistended. Bowel sounds present. No organomegaly or mass.  EXTREMITIES: Skin is warm there is swelling redness and pus with blood drainage from the right foot.  She has impressive excessive skin/plaque buildup to bilateral feet and up to the mid tibial shin, chronic changes of lymphedema and venous stasis on both legs. NEUROLOGIC: Grossly intact unable to assess secondary to patient's severe body posture. pSYCHIATRIC: The patient is alert and oriented x 3.  SKIN: No obvious rash, lesion, or ulcer.    Physical Exam LABORATORY PANEL:   CBC Recent Labs  Lab 09/30/17 1658  WBC 13.8*  HGB 9.5*  HCT 30.2*  PLT 289   ------------------------------------------------------------------------------------------------------------------  Chemistries  Recent Labs  Lab 09/30/17 1658  NA 135  K 4.0  CL 98*  CO2 28  GLUCOSE 144*  BUN 22*  CREATININE 0.81  CALCIUM 9.6  AST 45*  ALT 35  ALKPHOS 197*  BILITOT 0.3   ------------------------------------------------------------------------------------------------------------------  Cardiac Enzymes No results for input(s): TROPONINI in the last 168 hours. ------------------------------------------------------------------------------------------------------------------  RADIOLOGY:  Dg Ankle Complete Left  Result Date: 09/30/2017 CLINICAL DATA:  Bilateral leg pain progressively worsened over the past few months. Stasis dermatitis. Open laceration to bottom of right foot. EXAM: LEFT ANKLE COMPLETE - 3+ VIEW COMPARISON:  None. FINDINGS: Evaluation of osseous detail is limited by body habitus. Bone mineralization is grossly normal. Alignment appears normal. No acute or suspicious osseous lesion identified. No focal soft tissue abnormality seen. IMPRESSION: No acute findings.  Study limitations detailed above. Electronically Signed   By: Bary Richard M.D.   On: 09/30/2017 18:05   Dg Ankle Complete Right  Result Date:  09/30/2017 CLINICAL DATA:  Bilateral leg pain that has progressively worsened over the past few months. Pt reports she has stasis dermatitis. Pt has an open laceration to the bottom right foot. EXAM: RIGHT ANKLE - COMPLETE 3+ VIEW COMPARISON:  None. FINDINGS: Evaluation of osseous detail is limited by patient body habitus. Bone mineralization is grossly normal. Osseous alignment appears normal. No acute or suspicious osseous lesions seen. No focal soft tissue abnormality identified. Subtle lucency underlying the calcaneus may be related to the given history of laceration. IMPRESSION: No acute osseous abnormality identified. Study limitations detailed above. Lucency underlying the calcaneus may related to the given history of laceration. Electronically Signed   By: Bary RichardStan  Maynard M.D.   On: 09/30/2017 18:06   Mr Ankle Right Wo Contrast  Result Date: 10/01/2017 CLINICAL DATA:  History of chronic stasis dermatitis. The patient developed a sore on the right heel 1 month ago with pain and drainage. EXAM: MRI OF THE RIGHT ANKLE WITHOUT CONTRAST TECHNIQUE: Multiplanar, multisequence MR imaging of the ankle was performed. No intravenous contrast was administered. COMPARISON:  None. FINDINGS: TENDONS Peroneal: Intact. Posteromedial: Intact. Anterior: Intact. Achilles: Intact. Plantar Fascia: Thickening and edema are seen in the proximal 2 cm of both the medial and lateral cords. The plantar fascia is intact. LIGAMENTS Lateral: Intact. Medial: Intact. CARTILAGE Ankle Joint: Negative. Subtalar Joints/Sinus Tarsi: Negative. Bones: There is intense marrow edema in the dorsal 4 cm of the calcaneus which is worst subjacent to a skin ulceration on the heel. No fracture is identified. T1 and T2 hypointense line in the posterior, inferior calcaneus likely represents a growth arrest line or physeal plate. Other: Intense subcutaneous edema is present about the imaged lower leg, ankle and foot. A skin wound is seen on the plantar  surface of the foot. Deep and medial to the wound, there is focal fluid measuring 2.1 cm AP by 1.1 cm craniocaudal by 2.0 cm craniocaudal compatible with phlegmon or abscess. 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 Focal fluid collection deep and medial to the skin wound is consistent with phlegmon or abscess. Edema in the proximal 2 cm of the medial and lateral cords of the plantar fascia is worrisome for infection. Intense subcutaneous edema diffusely about the imaged lower leg, ankle and foot is consistent with dependent change and/or cellulitis. Negative for septic joint. Electronically Signed   By: Drusilla Kannerhomas  Dalessio M.D.   On: 10/01/2017 08:52    ASSESSMENT AND PLAN:   Active Problems:   Sepsis (HCC)  Army ChacoLisa Willhite  is a 47 y.o. female with past medical history of stasis dermatitis both lower extremity presents to the emergency room department for worsening right foot pain and worsening bilateral lower extremity swelling with fungating skin infection along with significant amount of drainage from the right heel.  Patient says at baseline her legs have been swollen with black/brown scaly skin that he relates on her feet falls off on its own.  She used to follow-up with a dermatologist however has lost to do so in many months.  1.  Sepsis secondary to bilateral lower extremity cellulitis, right heel osteomyelitis  significant wound infection foul-smelling right foot more than the left -Patient has severe amount of drainage from the right heel.  -IV Vanco and Zosyn -Podiaty, and vascular consultation -Wound consult -X-ray both feet no evidence of osteomyelitis noted on plain x-ray film - Initial debridement done by podiatry, more today with some debridement of bone. - vascular to do angiogram  today,and plan for out pt lymphedema pump.  2.  Hyperglycemia Check A1c Sliding scale insulin  3.  Patient states she has history  of thyroid problem - normal TSH  4.  DVT prophylaxis subcu Lovenox    All the records are reviewed and case discussed with Care Management/Social Workerr. Management plans discussed with the patient, family and they are in agreement.  CODE STATUS: Full.  TOTAL TIME TAKING CARE OF THIS PATIENT: 35 minutes.    POSSIBLE D/C IN 1-2 DAYS, DEPENDING ON CLINICAL CONDITION.   Altamese Dilling M.D on 10/01/2017   Between 7am to 6pm - Pager - [phone]  After 6pm go to [email or web address] - password [email or web address]  [facility]  Office  [phone]  CC: Primary care physician; Patient, No Pcp Per  Note: This dictation was prepared with Dragon dictation along with smaller phrase technology. Any transcriptional errors that result from this process are unintentional.

## 2017-10-01 NOTE — H&P (View-Only) (Signed)
Subjective/Chief Complaint: Patient seen. Somewhat groggy.   Objective: Vital signs in last 24 hours: Temp:  [96 F (35.6 C)] 96 F (35.6 C) (01/03 0621) Pulse Rate:  [80-95] 92 (01/03 0621) Resp:  [16-21] 16 (01/03 0547) BP: (93-122)/(39-75) 110/50 (01/03 0621) SpO2:  [97 %-100 %] 99 % (01/03 0547) Weight:  [127 kg (280 lb)] 127 kg (280 lb) (01/02 2033) Last BM Date: 09/30/17  Intake/Output from previous day: 01/02 0701 - 01/03 0700 In: 2500 [P.O.:200; IV Piggyback:2300] Out: 1000 [Urine:1000] Intake/Output this shift: Total I/O In: 500 [IV Piggyback:500] Out: -   The bandages dry and intact on the right foot and heel. MRI did reveal soft tissue abscess on the right heel as well as evidence for osteomyelitis at the inferior posterior aspect of the heel bone.  Lab Results:  Recent Labs    09/30/17 1658  WBC 13.8*  HGB 9.5*  HCT 30.2*  PLT 289   BMET Recent Labs    09/30/17 1658  NA 135  K 4.0  CL 98*  CO2 28  GLUCOSE 144*  BUN 22*  CREATININE 0.81  CALCIUM 9.6   PT/INR No results for input(s): LABPROT, INR in the last 72 hours. ABG No results for input(s): PHART, HCO3 in the last 72 hours.  Invalid input(s): PCO2, PO2  Studies/Results: Dg Ankle Complete Left  Result Date: 09/30/2017 CLINICAL DATA:  Bilateral leg pain progressively worsened over the past few months. Stasis dermatitis. Open laceration to bottom of right foot. EXAM: LEFT ANKLE COMPLETE - 3+ VIEW COMPARISON:  None. FINDINGS: Evaluation of osseous detail is limited by body habitus. Bone mineralization is grossly normal. Alignment appears normal. No acute or suspicious osseous lesion identified. No focal soft tissue abnormality seen. IMPRESSION: No acute findings.  Study limitations detailed above. Electronically Signed   By: Bary RichardStan  Maynard M.D.   On: 09/30/2017 18:05   Dg Ankle Complete Right  Result Date: 09/30/2017 CLINICAL DATA:  Bilateral leg pain that has progressively worsened over  the past few months. Pt reports she has stasis dermatitis. Pt has an open laceration to the bottom right foot. EXAM: RIGHT ANKLE - COMPLETE 3+ VIEW COMPARISON:  None. FINDINGS: Evaluation of osseous detail is limited by patient body habitus. Bone mineralization is grossly normal. Osseous alignment appears normal. No acute or suspicious osseous lesions seen. No focal soft tissue abnormality identified. Subtle lucency underlying the calcaneus may be related to the given history of laceration. IMPRESSION: No acute osseous abnormality identified. Study limitations detailed above. Lucency underlying the calcaneus may related to the given history of laceration. Electronically Signed   By: Bary RichardStan  Maynard M.D.   On: 09/30/2017 18:06   Mr Ankle Right Wo Contrast  Result Date: 10/01/2017 CLINICAL DATA:  History of chronic stasis dermatitis. The patient developed a sore on the right heel 1 month ago with pain and drainage. EXAM: MRI OF THE RIGHT ANKLE WITHOUT CONTRAST TECHNIQUE: Multiplanar, multisequence MR imaging of the ankle was performed. No intravenous contrast was administered. COMPARISON:  None. FINDINGS: TENDONS Peroneal: Intact. Posteromedial: Intact. Anterior: Intact. Achilles: Intact. Plantar Fascia: Thickening and edema are seen in the proximal 2 cm of both the medial and lateral cords. The plantar fascia is intact. LIGAMENTS Lateral: Intact. Medial: Intact. CARTILAGE Ankle Joint: Negative. Subtalar Joints/Sinus Tarsi: Negative. Bones: There is intense marrow edema in the dorsal 4 cm of the calcaneus which is worst subjacent to a skin ulceration on the heel. No fracture is identified. T1 and T2 hypointense line in  the posterior, inferior calcaneus likely represents a growth arrest line or physeal plate. Other: Intense subcutaneous edema is present about the imaged lower leg, ankle and foot. A skin wound is seen on the plantar surface of the foot. Deep and medial to the wound, there is focal fluid measuring 2.1  cm AP by 1.1 cm craniocaudal by 2.0 cm craniocaudal compatible with phlegmon or abscess. 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 Focal fluid collection deep and medial to the skin wound is consistent with phlegmon or abscess. Edema in the proximal 2 cm of the medial and lateral cords of the plantar fascia is worrisome for infection. Intense subcutaneous edema diffusely about the imaged lower leg, ankle and foot is consistent with dependent change and/or cellulitis. Negative for septic joint. Electronically Signed   By: Drusilla Kanner M.D.   On: 10/01/2017 08:52    Anti-infectives: Anti-infectives (From admission, onward)   Start     Dose/Rate Route Frequency Ordered Stop   10/01/17 1400  vancomycin (VANCOCIN) IVPB 1000 mg/200 mL premix     1,000 mg 200 mL/hr over 60 Minutes Intravenous Every 8 hours 10/01/17 1047     10/01/17 1210  cefUROXime (ZINACEF) 1.5 g in dextrose 5 % 50 mL IVPB     1.5 g 100 mL/hr over 30 Minutes Intravenous 30 min pre-op 10/01/17 1210     10/01/17 0400  piperacillin-tazobactam (ZOSYN) IVPB 3.375 g     3.375 g 12.5 mL/hr over 240 Minutes Intravenous Every 8 hours 09/30/17 1933     09/30/17 2200  vancomycin (VANCOCIN) 1,500 mg in sodium chloride 0.9 % 500 mL IVPB  Status:  Discontinued     1,500 mg 250 mL/hr over 120 Minutes Intravenous Every 8 hours 09/30/17 1939 10/01/17 1044   09/30/17 1715  vancomycin (VANCOCIN) IVPB 1000 mg/200 mL premix     1,000 mg 200 mL/hr over 60 Minutes Intravenous  Once 09/30/17 1714 09/30/17 1905   09/30/17 1715  piperacillin-tazobactam (ZOSYN) IVPB 3.375 g     3.375 g 100 mL/hr over 30 Minutes Intravenous  Once 09/30/17 1714 09/30/17 1825      Assessment/Plan: s/p Procedure(s): IRRIGATION AND DEBRIDEMENT FOOT (Right) Assessment: Ulceration with abscess and osteomyelitis right heel   Plan: Discussed with the patient the need for debridement of the soft tissue and  the bone in her right heel. Discussed possible risks and complications of the procedure including inability to heal due to continued infection and/or circulation which may require further debridements. Also discussed that she is at significant risk for lower extremity amputation. Questions were invited and answered. Patient elects to proceed with the debridement for later on tonight and we will obtain a consent for debridement of the soft tissues and bone. Plan for surgery later this evening  LOS: 1 day    Ricci Barker 10/01/2017

## 2017-10-01 NOTE — Consult Note (Signed)
[facility] Vascular Consult Note  MRN : [id number]  Charlene Jackson is a 47 y.o. (Jan 25, 1971) female who presents with chief complaint of  Chief Complaint  Patient presents with  . Leg Pain  .  History of Present Illness: I am asked by Dr. Jacqualyn Posey and Dr. Alberteen Spindle to see the patient for non-healing ulcer of the right leg/heel.  This has been present for about a month.  She does not know of any clear inciting event although this is clearly a pressure spot on a very immobile, morbidly obese patient.  She says this is quite painful.  This is associated with marked leg swelling but she has had this leg swelling from years in both legs.  Surrounding erythema.  Dr. Alberteen Spindle performed a bedside debridement but a full surgical debridement is still going to be necessary.  She has insulin requiring hyperglycemia here in the hospital among other medical comorbidities impairing wound healing.  She had not gotten a primary care physician and followed up over time and had not been getting medical care for a year or more.  She denies any previous vascular surgery or intervention to her knowledge.  No left leg ulcerations currently although marked swelling and stasis dermatitis changes are present bilaterally.  Current Facility-Administered Medications  Medication Dose Route Frequency Provider Last Rate Last Dose  . acetaminophen (TYLENOL) tablet 650 mg  650 mg Oral Q6H PRN Enedina Finner, MD       Or  . acetaminophen (TYLENOL) suppository 650 mg  650 mg Rectal Q6H PRN Enedina Finner, MD      . bisacodyl (DULCOLAX) suppository 10 mg  10 mg Rectal Daily PRN Enedina Finner, MD      . enoxaparin (LOVENOX) injection 40 mg  40 mg Subcutaneous Q12H Enedina Finner, MD   40 mg at 10/01/17 4098  . insulin aspart (novoLOG) injection 0-9 Units  0-9 Units Subcutaneous TID WC Enedina Finner, MD      . piperacillin-tazobactam (ZOSYN) IVPB 3.375 g  3.375 g Intravenous Q8H Coffee, Gerre Pebbles, [facility]   Stopped at 10/01/17 1191   . polyethylene glycol (MIRALAX / GLYCOLAX) packet 17 g  17 g Oral Daily PRN Enedina Finner, MD      . traMADol Janean Sark) tablet 50 mg  50 mg Oral Q6H PRN Enedina Finner, MD   50 mg at 09/30/17 2205  . vancomycin (VANCOCIN) IVPB 1000 mg/200 mL premix  1,000 mg Intravenous Q8H Cindi Carbon, [facility]        Past Medical History Stasis dermatitis Morbid obesity Leg swelling Heel ulceration  Past Surgical History None  Social History Tobacco use until this admission No alcohol use No IV drug use No smokeless tobacco  Family History No bleeding disorders, clotting disorders, autoimmune diseases, or aneurysms  Allergies None known  REVIEW OF SYSTEMS (Negative unless checked)  Constitutional: [] Weight loss  [] Fever  [] Chills Cardiac: [] Chest pain   [] Chest pressure   [] Palpitations   [] Shortness of breath when laying flat   [] Shortness of breath at rest   [x] Shortness of breath with exertion. Vascular:  [] Pain in legs with walking   [] Pain in legs at rest   [] Pain in legs when laying flat   [] Claudication   [] Pain in feet when walking  [x] Pain in feet at rest  [x] Pain in feet when laying flat   [] History of DVT   [] Phlebitis   [x] Swelling in legs   [] Varicose veins   [x] Non-healing ulcers Pulmonary:   [] Uses home oxygen   []   Productive cough   [] Hemoptysis   [] Wheeze  [] COPD   [] Asthma Neurologic:  [] Dizziness  [] Blackouts   [] Seizures   [] History of stroke   [] History of TIA  [] Aphasia   [] Temporary blindness   [] Dysphagia   [] Weakness or numbness in arms   [] Weakness or numbness in legs Musculoskeletal:  [] Arthritis   [] Joint swelling   [] Joint pain   [] Low back pain Hematologic:  [] Easy bruising  [] Easy bleeding   [] Hypercoagulable state   [] Anemic  [] Hepatitis Gastrointestinal:  [] Blood in stool   [] Vomiting blood  [] Gastroesophageal reflux/heartburn   [] Difficulty swallowing. Genitourinary:  [] Chronic kidney disease   [] Difficult urination  [] Frequent urination  [] Burning with urination    [] Blood in urine Skin:  [] Rashes   [x] Ulcers   [x] Wounds Psychological:  [x] History of anxiety   []  History of major depression.  Physical Examination  Vitals:   09/30/17 2033 10/01/17 0107 10/01/17 0547 10/01/17 0621  BP: 122/60  (!) 93/39 (!) 110/50  Pulse: 95  80 92  Resp: 20  16   Temp:    (!) 96 F (35.6 C)  TempSrc:  Oral  Rectal  SpO2: 97%  99%   Weight: 127 kg (280 lb)     Height: 5\' 7"  (1.702 m)      Body mass index is 43.85 kg/m.  Gen:  WD/WN, NAD morbidly obese.  Head: [place]/AT, No temporalis wasting. Ear/Nose/Throat: Hearing grossly intact, nares w/o erythema or drainage, oropharynx w/o Erythema/Exudate Eyes: Sclera non-icteric, conjunctiva clear Neck: Trachea midline.  No JVD.  Pulmonary:  Good air movement, respirations not labored, equal bilaterally.  Cardiac: RRR, normal S1, S2. Vascular:  Vessel Right Left  Radial Palpable Palpable                          PT  not palpable  not palpable  DP  not palpable  not palpable    Musculoskeletal: Positive for significant lower extremity weakness.  extremities without ischemic changes.  3-4+ bilateral lower extremity swelling with marked stasis dermatitis changes and mild erythema in bilateral draining ulceration on the right heel with moderate  surrounding erythema Neurologic: Sensation grossly intact in extremities.  Symmetrical.  Speech is fluent. Motor exam as listed above. Psychiatric: Judgment intact, Mood & affect appropriate for pt's clinical situation. Dermatologic: Lower extremities as described above      CBC Lab Results  Component Value Date   WBC 13.8 (H) 09/30/2017   HGB 9.5 (L) 09/30/2017   HCT 30.2 (L) 09/30/2017   MCV 54.7 (L) 09/30/2017   PLT 289 09/30/2017    BMET    Component Value Date/Time   NA 135 09/30/2017 1658   K 4.0 09/30/2017 1658   CL 98 (L) 09/30/2017 1658   CO2 28 09/30/2017 1658   GLUCOSE 144 (H) 09/30/2017 1658   BUN 22 (H) 09/30/2017 1658   CREATININE 0.81  09/30/2017 1658   CALCIUM 9.6 09/30/2017 1658   GFRNONAA >60 09/30/2017 1658   GFRAA >60 09/30/2017 1658   Estimated Creatinine Clearance: 117.7 mL/min (by C-G formula based on SCr of 0.81 mg/dL).  COAG No results found for: INR, PROTIME  Radiology Dg Ankle Complete Left  Result Date: 09/30/2017 CLINICAL DATA:  Bilateral leg pain progressively worsened over the past few months. Stasis dermatitis. Open laceration to bottom of right foot. EXAM: LEFT ANKLE COMPLETE - 3+ VIEW COMPARISON:  None. FINDINGS: Evaluation of osseous detail is limited by body habitus. Bone mineralization is  grossly normal. Alignment appears normal. No acute or suspicious osseous lesion identified. No focal soft tissue abnormality seen. IMPRESSION: No acute findings.  Study limitations detailed above. Electronically Signed   By: Bary Richard M.D.   On: 09/30/2017 18:05   Dg Ankle Complete Right  Result Date: 09/30/2017 CLINICAL DATA:  Bilateral leg pain that has progressively worsened over the past few months. Pt reports she has stasis dermatitis. Pt has an open laceration to the bottom right foot. EXAM: RIGHT ANKLE - COMPLETE 3+ VIEW COMPARISON:  None. FINDINGS: Evaluation of osseous detail is limited by patient body habitus. Bone mineralization is grossly normal. Osseous alignment appears normal. No acute or suspicious osseous lesions seen. No focal soft tissue abnormality identified. Subtle lucency underlying the calcaneus may be related to the given history of laceration. IMPRESSION: No acute osseous abnormality identified. Study limitations detailed above. Lucency underlying the calcaneus may related to the given history of laceration. Electronically Signed   By: Bary Richard M.D.   On: 09/30/2017 18:06   Mr Ankle Right Wo Contrast  Result Date: 10/01/2017 CLINICAL DATA:  History of chronic stasis dermatitis. The patient developed a sore on the right heel 1 month ago with pain and drainage. EXAM: MRI OF THE RIGHT ANKLE  WITHOUT CONTRAST TECHNIQUE: Multiplanar, multisequence MR imaging of the ankle was performed. No intravenous contrast was administered. COMPARISON:  None. FINDINGS: TENDONS Peroneal: Intact. Posteromedial: Intact. Anterior: Intact. Achilles: Intact. Plantar Fascia: Thickening and edema are seen in the proximal 2 cm of both the medial and lateral cords. The plantar fascia is intact. LIGAMENTS Lateral: Intact. Medial: Intact. CARTILAGE Ankle Joint: Negative. Subtalar Joints/Sinus Tarsi: Negative. Bones: There is intense marrow edema in the dorsal 4 cm of the calcaneus which is worst subjacent to a skin ulceration on the heel. No fracture is identified. T1 and T2 hypointense line in the posterior, inferior calcaneus likely represents a growth arrest line or physeal plate. Other: Intense subcutaneous edema is present about the imaged lower leg, ankle and foot. A skin wound is seen on the plantar surface of the foot. Deep and medial to the wound, there is focal fluid measuring 2.1 cm AP by 1.1 cm craniocaudal by 2.0 cm craniocaudal compatible with phlegmon or abscess. 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 Focal fluid collection deep and medial to the skin wound is consistent with phlegmon or abscess. Edema in the proximal 2 cm of the medial and lateral cords of the plantar fascia is worrisome for infection. Intense subcutaneous edema diffusely about the imaged lower leg, ankle and foot is consistent with dependent change and/or cellulitis. Negative for septic joint. Electronically Signed   By: Drusilla Kanner M.D.   On: 10/01/2017 08:52      Assessment/Plan 1.  Right heel ulceration with infection.  Certainly at risk for arterial insufficiency and this may be the best so rapidly.  Unable to palpate reasonable pulses given her very large size and markedly swollen legs.  Angiogram with possible intervention was recommended and that will be  performed today.  Risks and benefits were discussed.  Patient understands this is clearly a critical and limb threatening situation 2.  Right lower extremity swelling.  Likely a combination of lymphedema, venous disease, and morbid obesity.  Even with elevation her legs are tremendously swollen which is making wound healing much more difficult.  Further evaluation and treatment as an outpatient with consideration for lymphedema pump, compression devices, and elevation as  well as weight loss would be recommended 3.  Hyperglycemia.  Likely undiagnosed diabetes.  Glucose control important and the progression of atherosclerosis as well as wound healing 4.  Tobacco use.  Clearly a risk factor for atherosclerotic disease.  Smoking cessation recommended.    Festus BarrenJason Dew, MD  10/01/2017 11:00 AM    This note was created with Dragon medical transcription system.  Any error is purely unintentional

## 2017-10-01 NOTE — Anesthesia Preprocedure Evaluation (Addendum)
Anesthesia Evaluation  Patient identified by MRN, date of birth, ID band Patient awake    Reviewed: Allergy & Precautions, H&P , NPO status , Patient's Chart, lab work & pertinent test results, reviewed documented beta blocker date and time   History of Anesthesia Complications Negative for: history of anesthetic complications  Airway Mallampati: III  TM Distance: >3 FB Neck ROM: full    Dental  (+) Dental Advidsory Given, Poor Dentition, Teeth Intact   Pulmonary neg pulmonary ROS, former smoker,           Cardiovascular Exercise Tolerance: Poor negative cardio ROS       Neuro/Psych negative neurological ROS  negative psych ROS   GI/Hepatic negative GI ROS, Neg liver ROS,   Endo/Other  diabetes, Poorly ControlledMorbid obesity  Renal/GU negative Renal ROS  negative genitourinary   Musculoskeletal   Abdominal   Peds  Hematology negative hematology ROS (+)   Anesthesia Other Findings History reviewed. No pertinent past medical history. History reviewed. No pertinent surgical history. BMI    Body Mass Index:  43.85 kg/m     Reproductive/Obstetrics negative OB ROS                            Anesthesia Physical Anesthesia Plan  ASA: III  Anesthesia Plan: General LMA   Post-op Pain Management:    Induction: Intravenous  PONV Risk Score and Plan: 4 or greater and Ondansetron and Dexamethasone  Airway Management Planned: LMA  Additional Equipment:   Intra-op Plan:   Post-operative Plan: Extubation in OR  Informed Consent: I have reviewed the patients History and Physical, chart, labs and discussed the procedure including the risks, benefits and alternatives for the proposed anesthesia with the patient or authorized representative who has indicated his/her understanding and acceptance.   Dental Advisory Given  Plan Discussed with: CRNA  Anesthesia Plan Comments:         Anesthesia Quick Evaluation

## 2017-10-01 NOTE — H&P (Signed)
[place] [facility] History & Physical Update  The patient was interviewed and re-examined.  The patient's previous History and Physical has been reviewed and is unchanged.  There is no change in the plan of care. We plan to proceed with the scheduled procedure.  Festus BarrenJason Julion Gatt, MD  10/01/2017, 2:51 PM

## 2017-10-01 NOTE — Op Note (Signed)
[place] [facility] Percutaneous Study/Intervention Procedural Note   Date of Surgery: 10/01/2017  Surgeon(s):Esker Dever   Assistants:none  Pre-operative Diagnosis: Right heel ulceration, nonpalpable pedal pulses  Post-operative diagnosis: Same  Procedure(s) Performed: 1. Ultrasound guidance for vascular access left femoral artery 2. Catheter placement into right SFA from left femoral approach 3. Aortogram and selective right lower extremity angiogram 4. StarClose closure device left femoral artery  EBL: 5 cc  Contrast: 30 cc  Fluoro Time: 1 minute  Moderate Conscious Sedation Time: approximately 15 minutes using 4 mg of Versed and 100 Mcg of Fentanyl  Indications: Patient is a 47 y.o.female with osteomyelitis and ulceration on the right heel. The patient has nonpalpable pedal pulses but given her significant swelling this could be deceiving.  Nonetheless, with the severe, limb threatening ulceration and infection angiogram is indicated for full evaluation and potential treatment. The patient is brought in for angiography for further evaluation and potential treatment. Risks and benefits are discussed and informed consent is obtained  Procedure: The patient was identified and appropriate procedural time out was performed. The patient was then placed supine on the table and prepped and draped in the usual sterile fashion.Moderate conscious sedation was administered during a face to face encounter with the patient throughout the procedure with my supervision of the RN administering medicines and monitoring the patient's vital signs, pulse oximetry, telemetry and mental status throughout from the start of the procedure until the patient was taken to the recovery room. Ultrasound was used to evaluate the left common femoral artery. It was patent . A digital ultrasound image was acquired. A Seldinger  needle was used to access the left common femoral artery under direct ultrasound guidance and a permanent image was performed. A 0.035 J wire was advanced without resistance and a 5Fr sheath was placed. Pigtail catheter was placed into the aorta and an AP aortogram was performed. This demonstrated normal renal arteries and normal aorta and iliac segments without significant stenosis. I then crossed the aortic bifurcation and advanced to the right femoral head.  After injection to evaluate the common femoral artery and femoral bifurcation, over the J-wire the pigtail catheter was advanced into the mid right superficial femoral artery for evaluation of the distal perfusion.  Selective right lower extremity angiogram was then performed. This demonstrated that there was a normal common femoral artery, profunda femoris artery, and superficial femoral artery which was continuous down to a normal popliteal artery without significant stenosis.  There was then a normal tibial trifurcation and three-vessel runoff distally without focal stenosis identified. The patient was found to have normal arterial perfusion and no intervention was required.  I elected to terminate the procedure. The sheath was removed and StarClose closure device was deployed in the left femoral artery with excellent hemostatic result. The patient was taken to the recovery room in stable condition having tolerated the procedure well.  Findings:  Aortogram: This demonstrated normal renal arteries and normal aorta and iliac segments without significant stenosis. Right lower Extremity:There was a normal right common femoral artery, profunda femoris artery, and superficial femoral artery which was continuous down to a normal popliteal artery without significant stenosis.  There was then a normal tibial trifurcation and three-vessel runoff distally without focal stenosis identified   Disposition: Patient was taken to the  recovery room in stable condition having tolerated the procedure well.  Complications: None  Festus BarrenJason Camrie Stock 10/01/2017 3:38 PM   This note was created with Dragon Medical transcription system. Any errors  in dictation are purely unintentional.

## 2017-10-01 NOTE — Progress Notes (Signed)
Pt lying flat in bed in NAD, pt's BG noted to to be 85 earlier, given apple juice.  Dr. Wyn Quakerew at bedside earlier to discuss procedure with pt.  Pt informed of aftercare instructions and plan of care for recovery and pt verbalizes understanding.  Pt denies any further needs at this time.

## 2017-10-01 NOTE — Progress Notes (Signed)
Pharmacy Antibiotic Note  Charlene Jackson is a 47 y.o. female admitted on 09/30/2017 with wound infection.  Pharmacy has been consulted for vancomycin and piperacillin/tazobactam dosing.  Plan: Continue piperacillin/tazobactam 3.375 g IV q8h EI  Patient has received several doses of current regimen of vancomycin 1500 mg IV q8h.  Will reduce dose based on kinetic calculations to vancomycin 1000 mg IV q8h and check VT at steady state. Goal VT 15-20 mcg/mL Patient is at risk of accumulation due to obesity  Kinetics: Using adjusted body weight = 87 kg, CrCl 100 mL/min Ke: 0.087 Half-life: 7.9 hrs Vd: 61 L Cmin (calculated) = 16 mcg/mL  Height: 5\' 7"  (170.2 cm) Weight: 280 lb (127 kg) IBW/kg (Calculated) : 61.6  Temp (24hrs), Avg:96 F (35.6 C), Min:96 F (35.6 C), Max:96 F (35.6 C)  Recent Labs  Lab 09/30/17 1658 09/30/17 1732 09/30/17 2150  WBC 13.8*  --   --   CREATININE 0.81  --   --   LATICACIDVEN  --  2.1* 0.9    Estimated Creatinine Clearance: 117.7 mL/min (by C-G formula based on SCr of 0.81 mg/dL).    Not on File  Micro:  1/2 Blood: No growth < 24 hours 1/3 Urine: Sent  Thank you for allowing pharmacy to be a part of this patient's care.  Charlene Jackson, PharmD, BCPS Clinical Pharmacist 10/01/2017 10:48 AM

## 2017-10-01 NOTE — Progress Notes (Signed)
 Subjective/Chief Complaint: Patient seen. Somewhat groggy.   Objective: Vital signs in last 24 hours: Temp:  [96 F (35.6 C)] 96 F (35.6 C) (01/03 0621) Pulse Rate:  [80-95] 92 (01/03 0621) Resp:  [16-21] 16 (01/03 0547) BP: (93-122)/(39-75) 110/50 (01/03 0621) SpO2:  [97 %-100 %] 99 % (01/03 0547) Weight:  [127 kg (280 lb)] 127 kg (280 lb) (01/02 2033) Last BM Date: 09/30/17  Intake/Output from previous day: 01/02 0701 - 01/03 0700 In: 2500 [P.O.:200; IV Piggyback:2300] Out: 1000 [Urine:1000] Intake/Output this shift: Total I/O In: 500 [IV Piggyback:500] Out: -   The bandages dry and intact on the right foot and heel. MRI did reveal soft tissue abscess on the right heel as well as evidence for osteomyelitis at the inferior posterior aspect of the heel bone.  Lab Results:  Recent Labs    09/30/17 1658  WBC 13.8*  HGB 9.5*  HCT 30.2*  PLT 289   BMET Recent Labs    09/30/17 1658  NA 135  K 4.0  CL 98*  CO2 28  GLUCOSE 144*  BUN 22*  CREATININE 0.81  CALCIUM 9.6   PT/INR No results for input(s): LABPROT, INR in the last 72 hours. ABG No results for input(s): PHART, HCO3 in the last 72 hours.  Invalid input(s): PCO2, PO2  Studies/Results: Dg Ankle Complete Left  Result Date: 09/30/2017 CLINICAL DATA:  Bilateral leg pain progressively worsened over the past few months. Stasis dermatitis. Open laceration to bottom of right foot. EXAM: LEFT ANKLE COMPLETE - 3+ VIEW COMPARISON:  None. FINDINGS: Evaluation of osseous detail is limited by body habitus. Bone mineralization is grossly normal. Alignment appears normal. No acute or suspicious osseous lesion identified. No focal soft tissue abnormality seen. IMPRESSION: No acute findings.  Study limitations detailed above. Electronically Signed   By: Stan  Maynard M.D.   On: 09/30/2017 18:05   Dg Ankle Complete Right  Result Date: 09/30/2017 CLINICAL DATA:  Bilateral leg pain that has progressively worsened over  the past few months. Pt reports she has stasis dermatitis. Pt has an open laceration to the bottom right foot. EXAM: RIGHT ANKLE - COMPLETE 3+ VIEW COMPARISON:  None. FINDINGS: Evaluation of osseous detail is limited by patient body habitus. Bone mineralization is grossly normal. Osseous alignment appears normal. No acute or suspicious osseous lesions seen. No focal soft tissue abnormality identified. Subtle lucency underlying the calcaneus may be related to the given history of laceration. IMPRESSION: No acute osseous abnormality identified. Study limitations detailed above. Lucency underlying the calcaneus may related to the given history of laceration. Electronically Signed   By: Stan  Maynard M.D.   On: 09/30/2017 18:06   Mr Ankle Right Wo Contrast  Result Date: 10/01/2017 CLINICAL DATA:  History of chronic stasis dermatitis. The patient developed a sore on the right heel 1 month ago with pain and drainage. EXAM: MRI OF THE RIGHT ANKLE WITHOUT CONTRAST TECHNIQUE: Multiplanar, multisequence MR imaging of the ankle was performed. No intravenous contrast was administered. COMPARISON:  None. FINDINGS: TENDONS Peroneal: Intact. Posteromedial: Intact. Anterior: Intact. Achilles: Intact. Plantar Fascia: Thickening and edema are seen in the proximal 2 cm of both the medial and lateral cords. The plantar fascia is intact. LIGAMENTS Lateral: Intact. Medial: Intact. CARTILAGE Ankle Joint: Negative. Subtalar Joints/Sinus Tarsi: Negative. Bones: There is intense marrow edema in the dorsal 4 cm of the calcaneus which is worst subjacent to a skin ulceration on the heel. No fracture is identified. T1 and T2 hypointense line in   the posterior, inferior calcaneus likely represents a growth arrest line or physeal plate. Other: Intense subcutaneous edema is present about the imaged lower leg, ankle and foot. A skin wound is seen on the plantar surface of the foot. Deep and medial to the wound, there is focal fluid measuring 2.1  cm AP by 1.1 cm craniocaudal by 2.0 cm craniocaudal compatible with phlegmon or abscess. 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 Focal fluid collection deep and medial to the skin wound is consistent with phlegmon or abscess. Edema in the proximal 2 cm of the medial and lateral cords of the plantar fascia is worrisome for infection. Intense subcutaneous edema diffusely about the imaged lower leg, ankle and foot is consistent with dependent change and/or cellulitis. Negative for septic joint. Electronically Signed   By: Thomas  Dalessio M.D.   On: 10/01/2017 08:52    Anti-infectives: Anti-infectives (From admission, onward)   Start     Dose/Rate Route Frequency Ordered Stop   10/01/17 1400  vancomycin (VANCOCIN) IVPB 1000 mg/200 mL premix     1,000 mg 200 mL/hr over 60 Minutes Intravenous Every 8 hours 10/01/17 1047     10/01/17 1210  cefUROXime (ZINACEF) 1.5 g in dextrose 5 % 50 mL IVPB     1.5 g 100 mL/hr over 30 Minutes Intravenous 30 min pre-op 10/01/17 1210     10/01/17 0400  piperacillin-tazobactam (ZOSYN) IVPB 3.375 g     3.375 g 12.5 mL/hr over 240 Minutes Intravenous Every 8 hours 09/30/17 1933     09/30/17 2200  vancomycin (VANCOCIN) 1,500 mg in sodium chloride 0.9 % 500 mL IVPB  Status:  Discontinued     1,500 mg 250 mL/hr over 120 Minutes Intravenous Every 8 hours 09/30/17 1939 10/01/17 1044   09/30/17 1715  vancomycin (VANCOCIN) IVPB 1000 mg/200 mL premix     1,000 mg 200 mL/hr over 60 Minutes Intravenous  Once 09/30/17 1714 09/30/17 1905   09/30/17 1715  piperacillin-tazobactam (ZOSYN) IVPB 3.375 g     3.375 g 100 mL/hr over 30 Minutes Intravenous  Once 09/30/17 1714 09/30/17 1825      Assessment/Plan: s/p Procedure(s): IRRIGATION AND DEBRIDEMENT FOOT (Right) Assessment: Ulceration with abscess and osteomyelitis right heel   Plan: Discussed with the patient the need for debridement of the soft tissue and  the bone in her right heel. Discussed possible risks and complications of the procedure including inability to heal due to continued infection and/or circulation which may require further debridements. Also discussed that she is at significant risk for lower extremity amputation. Questions were invited and answered. Patient elects to proceed with the debridement for later on tonight and we will obtain a consent for debridement of the soft tissues and bone. Plan for surgery later this evening  LOS: 1 day    Bronc Brosseau W Storey Stangeland 10/01/2017 

## 2017-10-01 NOTE — Progress Notes (Signed)
CODE SEPSIS - PHARMACY COMMUNICATION  **Broad Spectrum Antibiotics should be administered within 1 hour of Sepsis diagnosis**  Time Code Sepsis Called/Page Received: 19:11  Antibiotics Ordered: Vanc/Zosyn  Time of 1st antibiotic administration: 19:45  Additional action taken by pharmacy: None  If necessary, Name of Provider/Nurse Contacted:     Carola FrostNathan A Kamesha Herne ,PharmD Clinical Pharmacist  10/01/2017  8:06 AM

## 2017-10-01 NOTE — Consult Note (Signed)
WOC Nurse wound consult note Reason for Consult:Unstageable pressure injury to right heel.  Chronic venous stasis to bilateral lower legs.  Wound type:Chronic venous wounds Pressure Injury POA: Yes Measurement: 5 cm x 7 cm wound bed is 100% necrotic tissue to wound bed.   Chronic skin changes to bilateral lower legs, edema and weeping Wound bed: devitalized tissue Drainage (amount, consistency, odor) moderate serosanguinous  Necrotic odor Periwound:chronic skin changes MRI demonstrated osteomyelitis and cellulitis to surrounding tissue.  Is having an angiogram today and surgery is planning to debride at the bedside.  Albumin is 2.7, potentially impaired wound healing.  Nutrition consult ordered.  Dressing procedure/placement/frequency: Will defer to surgery for debridement and wound care orders.  Please re-consult if needed.  Will not follow at this time.  Please re-consult if needed.  Maple HudsonKaren Jurnie Garritano RN BSN CWON Pager [phone]

## 2017-10-01 NOTE — Progress Notes (Signed)
  Patient ID: Charlene PiesLisa R Jackson, female   DOB: 1970/10/13, 47 y.o.   MRN: [id number]  HISTORY:    Vitals:   10/01/17 0547 10/01/17 0621  BP: (!) 93/39 (!) 110/50  Pulse: 80 92  Resp: 16   Temp:  (!) 96 F (35.6 C)  SpO2: 99%      EXAM:     ASSESSMENT:    PLAN:       Hulda Marinimothy Rontae Inglett, MD

## 2017-10-01 NOTE — Progress Notes (Signed)
Pt was able to void.  Filled bedpan and wet bed 1000 cc.  UA sent.  Vitals 10/50, HR 92,  T 96.0 rectal.  [facility]. Henriette CombsSarah Dao Mearns RN

## 2017-10-02 ENCOUNTER — Inpatient Hospital Stay: Payer: Medicaid Other | Admitting: Anesthesiology

## 2017-10-02 ENCOUNTER — Encounter
Admission: EM | Disposition: A | Discharge status: Home Health | Payer: Self-pay | Source: Home / Self Care | Attending: Internal Medicine

## 2017-10-02 ENCOUNTER — Encounter: Payer: Self-pay | Admitting: Anesthesiology

## 2017-10-02 HISTORY — PX: IRRIGATION AND DEBRIDEMENT FOOT: SHX6602

## 2017-10-02 LAB — BASIC METABOLIC PANEL
Anion gap: 8 (ref 5–15)
BUN: 14 mg/dL (ref 6–20)
CO2: 27 mmol/L (ref 22–32)
Calcium: 8.4 mg/dL — ABNORMAL LOW (ref 8.9–10.3)
Chloride: 103 mmol/L (ref 101–111)
Creatinine, Ser: 1.02 mg/dL — ABNORMAL HIGH (ref 0.44–1.00)
GFR calc Af Amer: >60 mL/min (ref >60)
GFR calc non Af Amer: >60 mL/min (ref >60)
GLUCOSE: 118 mg/dL — AB (ref 65–99)
POTASSIUM: 3.9 mmol/L (ref 3.5–5.1)
Sodium: 138 mmol/L (ref 135–145)

## 2017-10-02 LAB — URINE CULTURE: CULTURE: NO GROWTH

## 2017-10-02 LAB — GLUCOSE, CAPILLARY
GLUCOSE-CAPILLARY: 142 mg/dL — AB (ref 65–99)
Glucose-Capillary: 91 mg/dL (ref 65–99)
Glucose-Capillary: 87 mg/dL (ref 65–99)
Glucose-Capillary: 130 mg/dL — ABNORMAL HIGH (ref 65–99)

## 2017-10-02 LAB — VANCOMYCIN, TROUGH: Vancomycin Tr: 34 ug/mL (ref 15–20)

## 2017-10-02 LAB — SEDIMENTATION RATE: Sed Rate: 88 mm/hr — ABNORMAL HIGH (ref 0–20)

## 2017-10-02 SURGERY — IRRIGATION AND DEBRIDEMENT FOOT
Anesthesia: General | Site: Foot | Laterality: Right | Wound class: Contaminated

## 2017-10-02 MED ORDER — ONDANSETRON HCL 4 MG/2ML IJ SOLN
INTRAMUSCULAR | Status: DC | PRN
  Administered 2017-10-02: 4 mg via INTRAVENOUS

## 2017-10-02 MED ORDER — FENTANYL CITRATE (PF) 100 MCG/2ML IJ SOLN
INTRAMUSCULAR | Status: AC
  Filled 2017-10-02: qty 2

## 2017-10-02 MED ORDER — LIDOCAINE HCL (PF) 2 % IJ SOLN
INTRAMUSCULAR | Status: AC
  Filled 2017-10-02: qty 10

## 2017-10-02 MED ORDER — SUGAMMADEX SODIUM 500 MG/5ML IV SOLN
INTRAVENOUS | Status: DC | PRN
  Administered 2017-10-02: 260 mg via INTRAVENOUS

## 2017-10-02 MED ORDER — FENTANYL CITRATE (PF) 100 MCG/2ML IJ SOLN
INTRAMUSCULAR | Status: DC | PRN
  Administered 2017-10-02: 100 ug via INTRAVENOUS

## 2017-10-02 MED ORDER — PROPOFOL 10 MG/ML IV BOLUS
INTRAVENOUS | Status: AC
  Filled 2017-10-02: qty 40

## 2017-10-02 MED ORDER — MORPHINE SULFATE (PF) 2 MG/ML IV SOLN: 2.0000 mg | INTRAVENOUS | Status: DC | PRN

## 2017-10-02 MED ORDER — FENTANYL CITRATE (PF) 100 MCG/2ML IJ SOLN
25.0000 ug | INTRAMUSCULAR | Status: DC | PRN
  Administered 2017-10-02 (×2): 50 ug via INTRAVENOUS

## 2017-10-02 MED ORDER — PHENYLEPHRINE HCL 10 MG/ML IJ SOLN
INTRAMUSCULAR | Status: AC
  Filled 2017-10-02: qty 1

## 2017-10-02 MED ORDER — ROCURONIUM BROMIDE 50 MG/5ML IV SOLN
INTRAVENOUS | Status: AC
Start: 2017-10-02 — End: ?
  Filled 2017-10-02: qty 1

## 2017-10-02 MED ORDER — PROPOFOL 10 MG/ML IV BOLUS
INTRAVENOUS | Status: DC | PRN
  Administered 2017-10-02: 200 mg via INTRAVENOUS

## 2017-10-02 MED ORDER — PROMETHAZINE HCL 25 MG/ML IJ SOLN: 6.2500 mg | INTRAMUSCULAR | Status: DC | PRN

## 2017-10-02 MED ORDER — SODIUM CHLORIDE 0.9 % IV SOLN
INTRAVENOUS | Status: DC | PRN
  Administered 2017-10-02: 07:00:00 via INTRAVENOUS

## 2017-10-02 MED ORDER — SUCCINYLCHOLINE CHLORIDE 20 MG/ML IJ SOLN
INTRAMUSCULAR | Status: AC
  Filled 2017-10-02: qty 1

## 2017-10-02 MED ORDER — MORPHINE SULFATE (PF) 4 MG/ML IV SOLN: 4.0000 mg | INTRAVENOUS | Status: DC | PRN

## 2017-10-02 MED ORDER — ROCURONIUM BROMIDE 100 MG/10ML IV SOLN
INTRAVENOUS | Status: DC | PRN
  Administered 2017-10-02: 5 mg via INTRAVENOUS
  Administered 2017-10-02: 45 mg via INTRAVENOUS

## 2017-10-02 MED ORDER — MIDAZOLAM HCL 2 MG/2ML IJ SOLN
INTRAMUSCULAR | Status: AC
  Filled 2017-10-02: qty 2

## 2017-10-02 MED ORDER — OXYCODONE-ACETAMINOPHEN 5-325 MG PO TABS
1.0000 | ORAL_TABLET | ORAL | Status: DC | PRN
  Administered 2017-10-05: 2 via ORAL
  Filled 2017-10-02: qty 2

## 2017-10-02 MED ORDER — EPHEDRINE SULFATE 50 MG/ML IJ SOLN
INTRAMUSCULAR | Status: DC | PRN
  Administered 2017-10-02: 10 mg via INTRAVENOUS

## 2017-10-02 MED ORDER — EPHEDRINE SULFATE 50 MG/ML IJ SOLN
INTRAMUSCULAR | Status: AC
  Filled 2017-10-02: qty 1

## 2017-10-02 MED ORDER — SUGAMMADEX SODIUM 500 MG/5ML IV SOLN
INTRAVENOUS | Status: AC
  Filled 2017-10-02: qty 5

## 2017-10-02 MED ORDER — LIDOCAINE HCL (CARDIAC) 20 MG/ML IV SOLN
INTRAVENOUS | Status: DC | PRN
  Administered 2017-10-02: 100 mg via INTRAVENOUS

## 2017-10-02 MED ORDER — MIDAZOLAM HCL 2 MG/2ML IJ SOLN
INTRAMUSCULAR | Status: DC | PRN
  Administered 2017-10-02: 2 mg via INTRAVENOUS

## 2017-10-02 MED ORDER — SUCCINYLCHOLINE CHLORIDE 20 MG/ML IJ SOLN
INTRAMUSCULAR | Status: DC | PRN
  Administered 2017-10-02: 140 mg via INTRAVENOUS

## 2017-10-02 SURGICAL SUPPLY — 55 items
BANDAGE ELASTIC 4 LF NS (GAUZE/BANDAGES/DRESSINGS) ×3 IMPLANT
BLADE OSCILLATING/SAGITTAL (BLADE)
BLADE SURG 15 STRL LF DISP TIS (BLADE) ×1 IMPLANT
BLADE SURG 15 STRL SS (BLADE) ×3
BLADE SW THK.38XMED LNG THN (BLADE) IMPLANT
BNDG CMPR 75X21 PLY HI ABS (MISCELLANEOUS) ×1
BNDG CMPR MED 5X4 ELC HKLP NS (GAUZE/BANDAGES/DRESSINGS) ×1
BNDG ESMARK 4X12 TAN STRL LF (GAUZE/BANDAGES/DRESSINGS) ×3 IMPLANT
BNDG GAUZE 4.5X4.1 6PLY STRL (MISCELLANEOUS) ×3 IMPLANT
CANISTER SUCT 1200ML W/VALVE (MISCELLANEOUS) ×3 IMPLANT
CNTNR SPEC 2.5X3XGRAD LEK (MISCELLANEOUS) ×1
CONT SPEC 4OZ STER OR WHT (MISCELLANEOUS) ×2
CONT SPEC 4OZ STRL OR WHT (MISCELLANEOUS) ×1
CONTAINER SPEC 2.5X3XGRAD LEK (MISCELLANEOUS) IMPLANT
CUFF TOURN 18 STER (MISCELLANEOUS) ×1 IMPLANT
CUFF TOURN DUAL PL 12 NO SLV (MISCELLANEOUS) ×3 IMPLANT
DRAPE FLUOR MINI C-ARM 54X84 (DRAPES) IMPLANT
DRSG MEPITEL 4X7.2 (GAUZE/BANDAGES/DRESSINGS) ×2 IMPLANT
DURAPREP 26ML APPLICATOR (WOUND CARE) ×3 IMPLANT
ELECT REM PT RETURN 9FT ADLT (ELECTROSURGICAL) ×3
ELECTRODE REM PT RTRN 9FT ADLT (ELECTROSURGICAL) ×1 IMPLANT
GAUZE PETRO XEROFOAM 1X8 (MISCELLANEOUS) ×3 IMPLANT
GAUZE SPONGE 4X4 12PLY STRL (GAUZE/BANDAGES/DRESSINGS) ×3 IMPLANT
GAUZE STRETCH 2X75IN STRL (MISCELLANEOUS) ×3 IMPLANT
GLOVE BIO SURGEON STRL SZ7.5 (GLOVE) ×3 IMPLANT
GLOVE INDICATOR 8.0 STRL GRN (GLOVE) ×3 IMPLANT
GOWN STRL REUS W/ TWL LRG LVL3 (GOWN DISPOSABLE) ×2 IMPLANT
GOWN STRL REUS W/TWL LRG LVL3 (GOWN DISPOSABLE) ×6
HANDPIECE VERSAJET DEBRIDEMENT (MISCELLANEOUS) ×3 IMPLANT
HEMOSTAT SURGICEL 2X3 (HEMOSTASIS) ×4 IMPLANT
KIT STIMULAN RAPID CURE 5CC (Orthopedic Implant) ×4 IMPLANT
LABEL OR SOLS (LABEL) ×3 IMPLANT
NDL FILTER BLUNT 18X1 1/2 (NEEDLE) ×1 IMPLANT
NDL HYPO 25X1 1.5 SAFETY (NEEDLE) ×3 IMPLANT
NEEDLE FILTER BLUNT 18X 1/2SAF (NEEDLE) ×2
NEEDLE FILTER BLUNT 18X1 1/2 (NEEDLE) ×1 IMPLANT
NEEDLE HYPO 25X1 1.5 SAFETY (NEEDLE) ×9 IMPLANT
NS IRRIG 500ML POUR BTL (IV SOLUTION) ×3 IMPLANT
PACK EXTREMITY ARMC (MISCELLANEOUS) ×3 IMPLANT
PAD ABD DERMACEA PRESS 5X9 (GAUZE/BANDAGES/DRESSINGS) ×6 IMPLANT
PENCIL ELECTRO HAND CTR (MISCELLANEOUS) ×3 IMPLANT
RASP SM TEAR CROSS CUT (RASP) IMPLANT
SOL PREP PVP 2OZ (MISCELLANEOUS) ×3
SOLUTION PREP PVP 2OZ (MISCELLANEOUS) ×1 IMPLANT
STOCKINETTE 48X4 2 PLY STRL (GAUZE/BANDAGES/DRESSINGS) ×1 IMPLANT
STOCKINETTE STRL 4IN 9604848 (GAUZE/BANDAGES/DRESSINGS) ×3 IMPLANT
STOCKINETTE STRL 6IN 960660 (GAUZE/BANDAGES/DRESSINGS) ×3 IMPLANT
SUT ETHILON 4-0 (SUTURE) ×3
SUT ETHILON 4-0 FS2 18XMFL BLK (SUTURE) ×1
SUT VIC AB 3-0 SH 27 (SUTURE) ×3
SUT VIC AB 3-0 SH 27X BRD (SUTURE) ×1 IMPLANT
SUT VIC AB 4-0 FS2 27 (SUTURE) ×3 IMPLANT
SUTURE ETHLN 4-0 FS2 18XMF BLK (SUTURE) ×1 IMPLANT
SYR 10ML LL (SYRINGE) ×6 IMPLANT
SYR 3ML LL SCALE MARK (SYRINGE) ×3 IMPLANT

## 2017-10-02 NOTE — Anesthesia Postprocedure Evaluation (Signed)
Anesthesia Post Note  Patient: Charlene PiesLisa R Jackson  Procedure(s) Performed: IRRIGATION AND DEBRIDEMENT FOOT (Right Foot)  Patient location during evaluation: PACU Anesthesia Type: General Level of consciousness: awake and alert Pain management: pain level controlled Vital Signs Assessment: post-procedure vital signs reviewed and stable Respiratory status: spontaneous breathing, nonlabored ventilation, respiratory function stable and patient connected to nasal cannula oxygen Cardiovascular status: blood pressure returned to baseline and stable Postop Assessment: no apparent nausea or vomiting Anesthetic complications: no     Last Vitals:  Vitals:   10/02/17 1020 10/02/17 1124  BP: (!) 125/54 (!) 112/48  Pulse: 94 91  Resp:    Temp: (!) 36.3 C 36.7 C  SpO2: 92% 95%    Last Pain:  Vitals:   10/02/17 1020  TempSrc: Oral  PainSc:                  Lenard SimmerAndrew Giavanni Odonovan

## 2017-10-02 NOTE — Progress Notes (Signed)
[facility] [place] [facility]   PATIENT NAME: Charlene Jackson    MR#:  [id number]  DATE OF BIRTH:  Mar 02, 1971  SUBJECTIVE:  CHIEF COMPLAINT:   Chief Complaint  Patient presents with  . Leg Pain   Have severe lymphedema and bilateral leg swelling, came with worsening pain and redness with some discharge from right heel. Found to have osteomyelitis on MRI. Vascular angiogram on the right lower extremity was done and did not show any abnormality. Status post podiatry procedure again with some debridement of bone.   REVIEW OF SYSTEMS:  CONSTITUTIONAL: No fever, positive for fatigue or weakness.  EYES: No blurred or double vision.  EARS, NOSE, AND THROAT: No tinnitus or ear pain.  RESPIRATORY: No cough, shortness of breath, wheezing or hemoptysis.  CARDIOVASCULAR: No chest pain, orthopnea, edema.  GASTROINTESTINAL: No nausea, vomiting, diarrhea or abdominal pain.  GENITOURINARY: No dysuria, hematuria.  ENDOCRINE: No polyuria, nocturia,  HEMATOLOGY: No anemia, easy bruising or bleeding SKIN: Chronic skin changes due to lymphedema and venous stasis on both legs. MUSCULOSKELETAL: No joint pain or arthritis.   NEUROLOGIC: No tingling, numbness, weakness.  PSYCHIATRY: No anxiety or depression.   ROS  DRUG ALLERGIES:  Not on File  VITALS:  Blood pressure 123/60, pulse 83, temperature 97.6 F (36.4 C), temperature source Oral, resp. rate 10, height 5\' 7"  (1.702 m), weight 127 kg (280 lb), last menstrual period 10/01/2017, SpO2 95 %.  PHYSICAL EXAMINATION:   GENERAL:  47 y.o.-year-old patient lying in the bed with no acute distress.  Obese EYES: Pupils equal, round, reactive to light and accommodation. No scleral icterus. Extraocular muscles intact.  HEENT: Head atraumatic, normocephalic. Oropharynx and nasopharynx clear.  NECK:  Supple, no jugular venous distention. No thyroid enlargement, no tenderness.  LUNGS: Normal breath sounds bilaterally, no wheezing,  rales,rhonchi or crepitation. No use of accessory muscles of respiration.  CARDIOVASCULAR: S1, S2 normal. No murmurs, rubs, or gallops.  ABDOMEN: Soft, nontender, nondistended. Bowel sounds present. No organomegaly or mass.  EXTREMITIES: Skin is warm there is swelling redness and pus with blood drainage from the right foot.  She has impressive excessive skin/plaque buildup to bilateral feet and up to the mid tibial shin, chronic changes of lymphedema and venous stasis on both legs. S/p surgical dressing on right foot. NEUROLOGIC: Grossly intact unable to assess secondary to patient's severe body posture. pSYCHIATRIC: The patient is alert and oriented x 3.  SKIN: No obvious rash, lesion, or ulcer.    Physical Exam LABORATORY PANEL:   CBC Recent Labs  Lab 09/30/17 1658  WBC 13.8*  HGB 9.5*  HCT 30.2*  PLT 289   ------------------------------------------------------------------------------------------------------------------  Chemistries  Recent Labs  Lab 09/30/17 1658  NA 135  K 4.0  CL 98*  CO2 28  GLUCOSE 144*  BUN 22*  CREATININE 0.81  CALCIUM 9.6  AST 45*  ALT 35  ALKPHOS 197*  BILITOT 0.3   ------------------------------------------------------------------------------------------------------------------  Cardiac Enzymes No results for input(s): TROPONINI in the last 168 hours. ------------------------------------------------------------------------------------------------------------------  RADIOLOGY:  Dg Ankle Complete Left  Result Date: 09/30/2017 CLINICAL DATA:  Bilateral leg pain progressively worsened over the past few months. Stasis dermatitis. Open laceration to bottom of right foot. EXAM: LEFT ANKLE COMPLETE - 3+ VIEW COMPARISON:  None. FINDINGS: Evaluation of osseous detail is limited by body habitus. Bone mineralization is grossly normal. Alignment appears normal. No acute or suspicious osseous lesion identified. No focal soft tissue abnormality seen.  IMPRESSION: No acute findings.  Study  limitations detailed above. Electronically Signed   By: Bary Richard M.D.   On: 09/30/2017 18:05   Dg Ankle Complete Right  Result Date: 09/30/2017 CLINICAL DATA:  Bilateral leg pain that has progressively worsened over the past few months. Pt reports she has stasis dermatitis. Pt has an open laceration to the bottom right foot. EXAM: RIGHT ANKLE - COMPLETE 3+ VIEW COMPARISON:  None. FINDINGS: Evaluation of osseous detail is limited by patient body habitus. Bone mineralization is grossly normal. Osseous alignment appears normal. No acute or suspicious osseous lesions seen. No focal soft tissue abnormality identified. Subtle lucency underlying the calcaneus may be related to the given history of laceration. IMPRESSION: No acute osseous abnormality identified. Study limitations detailed above. Lucency underlying the calcaneus may related to the given history of laceration. Electronically Signed   By: Bary Richard M.D.   On: 09/30/2017 18:06   Mr Ankle Right Wo Contrast  Result Date: 10/01/2017 CLINICAL DATA:  History of chronic stasis dermatitis. The patient developed a sore on the right heel 1 month ago with pain and drainage. EXAM: MRI OF THE RIGHT ANKLE WITHOUT CONTRAST TECHNIQUE: Multiplanar, multisequence MR imaging of the ankle was performed. No intravenous contrast was administered. COMPARISON:  None. FINDINGS: TENDONS Peroneal: Intact. Posteromedial: Intact. Anterior: Intact. Achilles: Intact. Plantar Fascia: Thickening and edema are seen in the proximal 2 cm of both the medial and lateral cords. The plantar fascia is intact. LIGAMENTS Lateral: Intact. Medial: Intact. CARTILAGE Ankle Joint: Negative. Subtalar Joints/Sinus Tarsi: Negative. Bones: There is intense marrow edema in the dorsal 4 cm of the calcaneus which is worst subjacent to a skin ulceration on the heel. No fracture is identified. T1 and T2 hypointense line in the posterior, inferior calcaneus likely  represents a growth arrest line or physeal plate. Other: Intense subcutaneous edema is present about the imaged lower leg, ankle and foot. A skin wound is seen on the plantar surface of the foot. Deep and medial to the wound, there is focal fluid measuring 2.1 cm AP by 1.1 cm craniocaudal by 2.0 cm craniocaudal compatible with phlegmon or abscess. 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 Focal fluid collection deep and medial to the skin wound is consistent with phlegmon or abscess. Edema in the proximal 2 cm of the medial and lateral cords of the plantar fascia is worrisome for infection. Intense subcutaneous edema diffusely about the imaged lower leg, ankle and foot is consistent with dependent change and/or cellulitis. Negative for septic joint. Electronically Signed   By: Drusilla Kanner M.D.   On: 10/01/2017 08:52    ASSESSMENT AND PLAN:   Active Problems:   Sepsis (HCC)  Charlene Jackson  is a 47 y.o. female with past medical history of stasis dermatitis both lower extremity presents to the emergency room department for worsening right foot pain and worsening bilateral lower extremity swelling with fungating skin infection along with significant amount of drainage from the right heel.  Patient says at baseline her legs have been swollen with black/brown scaly skin that he relates on her feet falls off on its own.  She used to follow-up with a dermatologist however has lost to do so in many months.  1.  Sepsis secondary to bilateral lower extremity cellulitis, right heel osteomyelitis  significant wound infection foul-smelling right foot more than the left -Patient has severe amount of drainage from the right heel.  -IV Vanco and Zosyn -Podiaty, and vascular consultation -Wound consult -  X-ray both feet no evidence of osteomyelitis noted on plain x-ray film- MRI confirmed OM. - Initial debridement done by podiatry. -Vascular  pedal angiogram to check for circulation on the right lower extremity and it was found satisfactory 10/01/17. - Further debridement and some scrapping of bone done by podiatry on 10/02/2017 - ID consult to guide IV antibiotic therapy.  2.  Hyperglycemia Check A1c Sliding scale insulin  3.  Patient states she has history of thyroid problem - normal TSH  4.  DVT prophylaxis subcu Lovenox  5. Bacteremia- appears to be contamination. Follow cultures.  All the records are reviewed and case discussed with Care Management/Social Workerr. Management plans discussed with the patient, family and they are in agreement.  CODE STATUS: Full.  TOTAL TIME TAKING CARE OF THIS PATIENT: 35 minutes.    POSSIBLE D/C IN 1-2 DAYS, DEPENDING ON CLINICAL CONDITION.   Altamese Dilling M.D on 10/02/2017   Between 7am to 6pm - Pager - [phone]  After 6pm go to [email or web address] - password [email or web address]  [facility]  Office  [phone]  CC: Primary care physician; Patient, No Pcp Per  Note: This dictation was prepared with Dragon dictation along with smaller phrase technology. Any transcriptional errors that result from this process are unintentional.

## 2017-10-02 NOTE — Clinical Social Work Note (Signed)
Clinical Social Work Assessment  Patient Details  Name: Charlene PiesLisa R Jackson MRN: [id number] Date of Birth: 01-02-1971  Date of referral:  10/02/17               Reason for consult:  Family Concerns                Permission sought to share information with:    Permission granted to share information::     Name::        Agency::     Relationship::     Contact Information:     Housing/Transportation Living arrangements for the past 2 months:  Apartment Source of Information:  Patient, Medical Team Patient Interpreter Needed:  None Criminal Activity/Legal Involvement Pertinent to Current Situation/Hospitalization:  No - Comment as needed Significant Relationships:  Dependent Children, Siblings Lives with:  Minor Children Do you feel safe going back to the place where you live?    Need for family participation in patient care:  Yes (Comment)  Care giving concerns:  Patient resides at home with her 49 year old daughter.   Social Worker assessment / plan:  CSW was asked to become involved regarding staff concerns of care of 49 year old daughter after speaking with patient. Staff reported that patient admitted to her 49 year old daughter caring for her maternal grandparents and now that they have passed, is caring for her. Patient was overheard telling her brother to not allow the 49 year old daughter to have any water and to buy her fatty foods so she will gain weight.   CSW spoke with patient and she states that they moved up to be with her parents a few years ago and took care of her parents for a year and a half. She stated that her daughter did help care for them and is now helping to care for her. 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 Thus, patient has not been in school for a few years at this time. When asked why she had told her brother to not  give her daughter water and to give her fatty foods, patient replied that she wants her daughter to gain weight and that she cannot gain weight on water. She stated her daughter wants to drink water in order to clear up her acne but she does not want her to.  CSW has made a DSS CPS [place] to [facility] regarding concern that daughter is not in school and that she is a minor having to care for family members.  In regards to patient's discharge planning. RN CM is following because patient does not have insurance and at this time cannot be placed in a SNF because she is not medicaid eligible according to [facility] financial services. Patient will have to have arrangements made for home at this time.  Employment status:  Unemployed Health and safety inspectornsurance information:  Self Pay (Medicaid Pending) PT Recommendations:    Information / Referral to community resources:     Patient/Family's Response to care:  Patient expressed appreciation for CSW visit.  Patient/Family's Understanding of and Emotional Response to Diagnosis, Current Treatment, and Prognosis:  Unclear at this time if patient sees the concern for her daughter not being in school and for having to care for family.  Emotional Assessment Appearance:  Appears older than stated age Attitude/Demeanor/Rapport:  (  pleasant and calm) Affect (typically observed):    Orientation:  Oriented to Self, Oriented to  Time, Oriented to Place, Oriented to Situation Alcohol / Substance use:  Not Applicable Psych involvement (Current and /or in the community):  No (Comment)  Discharge Needs  Concerns to be addressed:  Care Coordination Readmission within the last 30 days:  No Current discharge risk:  None Barriers to Discharge:  Inadequate or no insurance   [place], LCSW 10/02/2017, 4:15 PM

## 2017-10-02 NOTE — OR Nursing (Signed)
Urine pregnancy test is negative. Read on 10/02/17@0710 

## 2017-10-02 NOTE — Interval H&P Note (Signed)
History and Physical Interval Note:  10/02/2017 7:06 AM  Charlene Jackson  has presented today for surgery, with the diagnosis of infected right heel  The various methods of treatment have been discussed with the patient and family. After consideration of risks, benefits and other options for treatment, the patient has consented to  Procedure(s): IRRIGATION AND DEBRIDEMENT FOOT (Right) as a surgical intervention .  The patient's history has been reviewed, patient examined, no change in status, stable for surgery.  I have reviewed the patient's chart and labs.  Questions were answered to the patient's satisfaction.     Ricci Barkerodd W Montrez [place]

## 2017-10-02 NOTE — Anesthesia Post-op Follow-up Note (Signed)
Anesthesia QCDR form completed.        

## 2017-10-02 NOTE — Op Note (Signed)
Date of operation: 10/02/2017.  Surgeon: Ricci Barkerodd W Bay Jarquin DPM.  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  Postoperative diagnosis: Same.  Procedure: Excisional Debridement soft tissue and bone right heel.  Anesthesia: Gen.  Hemostasis: None.  Estimated blood loss: 100 cc.  Cultures: Bone cultures right calcaneus.  Pathology: Bone right calcaneus.  Implants: Stimulan rapid cure antibiotic beads impregnated with vancomycin.  Complications: None apparent.  Operative indications: This is a 47 year old female with a one-month history of a sore on her right heel. Patient was admitted with signs of sepsis and diagnosed with osteomyelitis and decision was made for surgical debridement.  Operative procedure: Patient was taken to the operating room and placed on the table in the supine position. Following satisfactory general anesthesia the patient was rotated onto a left lateral position and secured with a beanbag. The right foot was then prepped and draped in the usual sterile fashion. Attention was then directed to the plantar and posterior aspect of the right heel where a large necrotic ulceration was noted plantar and extending along the lateral surface. The necrosis was excised full-thickness down to the deeper subcutaneous tissue and level of bone using a 15 blade and removed. Significant necrosis was noted along the lateral aspect as well as extending distally at the ulceration. All areas of necrosis were debrided excisionally using a versa jet debrider on a setting of 5. The soft tissues were then freed off of the plantar and posterior aspect of the calcaneus using an elevator and the inferior portion of the calcaneus was resected using a sagittal saw and removed. During the debridement earlier a vein was partially incised and this was ligated using 2-0 Vicryl suture from a real. All remaining edges were then lightly debrided with the versa jet debrider on a  setting of 3 and then the wound was flushed with copious amounts of sterile saline. At this point a couple of pieces of Surgicel were placed into the base of the wound to control active bleeding. Antibiotic beads were placed into the distal aspect of the wound and then the wound was partially covered with a Mepitel and surgical staples and then the remainder of the antibiotic beads placed into the defect beneath the heel and then secured with the Mepitel and staples. 4 x 4's fluffs ABDs Kerlix and Ace wrap were then applied to the right lower extremity area patient was awakened and transported to PACU having tolerated the procedure with vital signs stable and in good condition.

## 2017-10-02 NOTE — Anesthesia Procedure Notes (Signed)
Procedure Name: Intubation Date/Time: 10/02/2017 7:48 AM Performed by: Silvana Newness, CRNA Pre-anesthesia Checklist: Patient identified, Emergency Drugs available, Suction available, Patient being monitored and Timeout performed Patient Re-evaluated:Patient Re-evaluated prior to induction Oxygen Delivery Method: Circle system utilized Preoxygenation: Pre-oxygenation with 100% oxygen Induction Type: IV induction Ventilation: Mask ventilation without difficulty Laryngoscope Size: Mac and 3 Grade View: Grade I Tube type: Oral Tube size: 7.0 mm Number of attempts: 1 Airway Equipment and Method: Stylet Placement Confirmation: ETT inserted through vocal cords under direct vision,  positive ETCO2 and breath sounds checked- equal and bilateral Secured at: 22 cm Tube secured with: Tape Dental Injury: Teeth and Oropharynx as per pre-operative assessment  Comments: Patient's teeth extreme decay noted in pre-op, no damage during intubation

## 2017-10-02 NOTE — Transfer of Care (Signed)
Immediate Anesthesia Transfer of Care Note  Patient: Charlene PiesLisa R Jackson  Procedure(s) Performed: IRRIGATION AND DEBRIDEMENT FOOT (Right Foot)  Patient Location: PACU  Anesthesia Type:General  Level of Consciousness: drowsy and patient cooperative  Airway & Oxygen Therapy: Patient Spontanous Breathing and Patient connected to face mask oxygen  Post-op Assessment: Report given to RN and Post -op Vital signs reviewed and stable  Post vital signs: Reviewed and stable  Last Vitals:  Vitals:   10/01/17 1932 10/02/17 0527  BP: (!) 112/50 (!) 125/43  Pulse: 93 99  Resp: 18 18  Temp: 36.5 C 36.6 C  SpO2: 99% 96%    Last Pain:  Vitals:   10/02/17 0527  TempSrc: Oral  PainSc:       Patients Stated Pain Goal: 2 (09/30/17 2033)  Complications: No apparent anesthesia complications

## 2017-10-02 NOTE — Progress Notes (Signed)
Pharmacy Antibiotic Note  Charlene Jackson is a 47 y.o. female admitted on 09/30/2017 with wound infection.  Pharmacy has been consulted for vancomycin and piperacillin/tazobactam dosing.  Plan: Continue piperacillin/tazobactam 3.375 g IV q8h EI  Patient has received several doses of current regimen of vancomycin 1500 mg IV q8h.  Will reduce dose based on kinetic calculations to vancomycin 1000 mg IV q8h and check VT at steady state. Goal VT 15-20 mcg/mL Patient is at risk of accumulation due to obesity  Kinetics: Using adjusted body weight = 87 kg, CrCl 100 mL/min Ke: 0.087 Half-life: 7.9 hrs Vd: 61 L Cmin (calculated) = 16 mcg/mL  1/4: VT @ 21:30 = 34 mcg/mL .   Level seems to be drawn appropriately ,  Toxic level is probably due to accumulation. Will hold Vanc for now and recheck SrCr.   Height: 5\' 7"  (170.2 cm) Weight: 280 lb (127 kg) IBW/kg (Calculated) : 61.6  Temp (24hrs), Avg:97.7 F (36.5 C), Min:97.4 F (36.3 C), Max:98.1 F (36.7 C)  Recent Labs  Lab 09/30/17 1658 09/30/17 1732 09/30/17 2150 10/02/17 2135  WBC 13.8*  --   --   --   CREATININE 0.81  --   --   --   LATICACIDVEN  --  2.1* 0.9  --   VANCOTROUGH  --   --   --  34*    Estimated Creatinine Clearance: 117.7 mL/min (by C-G formula based on SCr of 0.81 mg/dL).    Not on File  Micro:  1/2 Blood: No growth < 24 hours 1/3 Urine: Sent  Thank you for allowing pharmacy to be a part of this patient's care.  Scherrie Gerlachobbins,Subhan Hoopes D, PharmD Clinical Pharmacist 10/02/2017 10:08 PM

## 2017-10-02 NOTE — Consult Note (Signed)
[facility]     Reason for Consult: Foot infection osteomyelitis     Referring Physician: Nicholes Mango  Date of Admission:  09/30/2017   Active Problems:   Sepsis [facility])   HPI: Charlene Jackson is a 47 y.o. female with hx obesity and chronic venous stasis and LE edema admitted with R heel pus and drainage as well as worsening edema.  On admit wbc was 13.  MRI showed calcaneal osteomyelitis, abscess and cellulitis. She underwent angiogram and then debridement 10/02/16.  [place] with CNS. Wound cx mixed.  History reviewed. No pertinent past medical history. Past Surgical History:  Procedure Laterality Date  . IRRIGATION AND DEBRIDEMENT FOOT Right 10/02/2017   Procedure: IRRIGATION AND DEBRIDEMENT FOOT;  Surgeon: Sharlotte Alamo, DPM;  Location: [facility] ORS;  Service: Podiatry;  Laterality: Right;  . LOWER EXTREMITY ANGIOGRAPHY N/A 10/01/2017   Procedure: LOWER EXTREMITY ANGIOGRAPHY;  Surgeon: Algernon Huxley, MD;  Location: [place] CV LAB;  Service: Cardiovascular;  Laterality: N/A;   Social History   Tobacco Use  . Smoking status: Former Smoker    Last attempt to quit: 09/29/2017  . Smokeless tobacco: Never Used  Substance Use Topics  . Alcohol use: Not on file  . Drug use: Not on file   History reviewed. No pertinent family history.  Allergies: Not on File  Current antibiotics: Antibiotics Given (last 72 hours)    Date/Time Action Medication Dose Rate   09/30/17 1745 New Bag/Given   piperacillin-tazobactam (ZOSYN) IVPB 3.375 g 3.375 g 100 mL/hr   09/30/17 1750 New Bag/Given   vancomycin (VANCOCIN) IVPB 1000 mg/200 mL premix 1,000 mg 200 mL/hr   09/30/17 2330 New Bag/Given   vancomycin (VANCOCIN) 1,500 mg in sodium chloride 0.9 % 500 mL IVPB 1,500 mg 250 mL/hr   10/01/17 0406 New Bag/Given   piperacillin-tazobactam (ZOSYN) IVPB 3.375 g 3.375 g 12.5 mL/hr   10/01/17 0517 New Bag/Given   vancomycin (VANCOCIN) 1,500 mg in sodium chloride 0.9 % 500 mL IVPB 1,500 mg 250  mL/hr   10/01/17 1350 New Bag/Given   piperacillin-tazobactam (ZOSYN) IVPB 3.375 g 3.375 g 12.5 mL/hr   10/01/17 1350 New Bag/Given   vancomycin (VANCOCIN) IVPB 1000 mg/200 mL premix 1,000 mg 200 mL/hr   10/01/17 2046 New Bag/Given   piperacillin-tazobactam (ZOSYN) IVPB 3.375 g 3.375 g 12.5 mL/hr   10/01/17 2053 New Bag/Given   vancomycin (VANCOCIN) IVPB 1000 mg/200 mL premix 1,000 mg 200 mL/hr   10/02/17 0501 New Bag/Given   piperacillin-tazobactam (ZOSYN) IVPB 3.375 g 3.375 g 12.5 mL/hr   10/02/17 0501 New Bag/Given   vancomycin (VANCOCIN) IVPB 1000 mg/200 mL premix 1,000 mg 200 mL/hr   10/02/17 1408 New Bag/Given   piperacillin-tazobactam (ZOSYN) IVPB 3.375 g 3.375 g 12.5 mL/hr   10/02/17 1410 New Bag/Given   vancomycin (VANCOCIN) IVPB 1000 mg/200 mL premix 1,000 mg 200 mL/hr      MEDICATIONS: . enoxaparin (LOVENOX) injection  40 mg Subcutaneous Q12H  . fentaNYL      . insulin aspart  0-9 Units Subcutaneous TID WC    Review of Systems - 11 systems reviewed and negative per HPI   OBJECTIVE: Temp:  [97.4 F (36.3 C)-98.1 F (36.7 C)] 97.6 F (36.4 C) (01/04 1218) Pulse Rate:  [79-99] 83 (01/04 1218) Resp:  [10-27] 10 (01/04 1001) BP: (90-133)/(34-61) 123/60 (01/04 1218) SpO2:  [91 %-99 %] 95 % (01/04 1218) Physical Exam  Constitutional:  oriented to person, place, and time. appears well-developed and well-nourished.  No distress. obese HENT: [place]/AT, PERRLA, no scleral icterus Mouth/Throat: Oropharynx is clear and moist. No oropharyngeal exudate.  Cardiovascular: Normal rate, regular rhythm and normal heart sounds. Exam reveals no gallop and no friction rub.  No murmur heard.  Pulmonary/Chest: Effort normal and breath sounds normal. No respiratory distress.  has no wheezes.  Neck = supple, no nuchal rigidity Abdominal: Soft. Bowel sounds are normal.  exhibits no distension. There is no tenderness.  Lymphadenopathy: no cervical adenopathy. No axillary adenopathy Ext il  chronic 2+ edema Neurological: alert and oriented to person, place, and time.  Skin: chronic erythema to about mid calf with significant papilloma raised lesions. R ankle and heel covered ost op.  Psychiatric: a normal mood and affect.  behavior is normal.    LABS: Results for orders placed or performed during the hospital encounter of 09/30/17 (from the past 48 hour(s))  CBC with Differential     Status: Abnormal   Collection Time: 09/30/17  4:58 PM  Result Value Ref Range   WBC 13.8 (H) 3.6 - 11.0 K/uL   RBC 5.51 (H) 3.80 - 5.20 MIL/uL   Hemoglobin 9.5 (L) 12.0 - 16.0 g/dL   HCT 30.2 (L) 35.0 - 47.0 %   MCV 54.7 (L) 80.0 - 100.0 fL   MCH 17.2 (L) 26.0 - 34.0 pg   MCHC 31.5 (L) 32.0 - 36.0 g/dL   RDW 21.5 (H) 11.5 - 14.5 %   Platelets 289 150 - 440 K/uL   Neutrophils Relative % 84 %   Neutro Abs 11.4 (H) 1.4 - 6.5 K/uL   Lymphocytes Relative 11 %   Lymphs Abs 1.6 1.0 - 3.6 K/uL   Monocytes Relative 5 %   Monocytes Absolute 0.7 0.2 - 0.9 K/uL   Eosinophils Relative 0 %   Eosinophils Absolute 0.1 0 - 0.7 K/uL   Basophils Relative 0 %   Basophils Absolute 0.0 0 - 0.1 K/uL    Comment: Performed at [facility], [address] [place], [place] [address]  Comprehensive metabolic panel     Status: Abnormal   Collection Time: 09/30/17  4:58 PM  Result Value Ref Range   Sodium 135 135 - 145 mmol/L   Potassium 4.0 3.5 - 5.1 mmol/L   Chloride 98 (L) 101 - 111 mmol/L   CO2 28 22 - 32 mmol/L   Glucose, Bld 144 (H) 65 - 99 mg/dL   BUN 22 (H) 6 - 20 mg/dL   Creatinine, Ser 0.81 0.44 - 1.00 mg/dL   Calcium 9.6 8.9 - 10.3 mg/dL   Total Protein 8.6 (H) 6.5 - 8.1 g/dL   Albumin 2.7 (L) 3.5 - 5.0 g/dL   AST 45 (H) 15 - 41 U/L   ALT 35 14 - 54 U/L   Alkaline Phosphatase 197 (H) 38 - 126 U/L   Total Bilirubin 0.3 0.3 - 1.2 mg/dL   GFR calc non Af Amer >60 >60 mL/min   GFR calc Af Amer >60 >60 mL/min    Comment: (NOTE) The eGFR has been calculated using the CKD EPI  equation. This calculation has not been validated in all clinical situations. eGFR's persistently <60 mL/min signify possible Chronic Kidney Disease.    Anion gap 9 5 - 15    Comment: Performed at [facility], [address] [place], [place] [address]  TSH     Status: None   Collection Time: 09/30/17  4:58 PM  Result Value Ref Range   TSH 1.375 0.350 -  4.500 uIU/mL    Comment: Performed by a 3rd Generation assay with a functional sensitivity of <=0.01 uIU/mL. Performed at [facility], [address] [place], [place] [address]   Hemoglobin A1c     Status: None   Collection Time: 09/30/17  4:58 PM  Result Value Ref Range   Hgb A1c MFr Bld 5.4 4.8 - 5.6 %    Comment: (NOTE) Pre diabetes:          5.7%-6.4% Diabetes:              >6.4% Glycemic control for   <7.0% adults with diabetes    Mean Plasma Glucose 108.28 mg/dL    Comment: Performed at [place] [address] [place], [place] [address]  Lactic acid, plasma     Status: Abnormal   Collection Time: 09/30/17  5:32 PM  Result Value Ref Range   Lactic Acid, Venous 2.1 (HH) 0.5 - 1.9 mmol/L    Comment: CRITICAL RESULT CALLED TO, READ BACK BY AND VERIFIED WITH DAJEA SCOTT AT 1817 09/30/2017 BY TFK. Performed at [facility], [address] [place], [place] [address]   Blood culture (routine x 2)     Status: Abnormal (Preliminary result)   Collection Time: 09/30/17  5:32 PM  Result Value Ref Range   Specimen Description      BLOOD BLOOD LEFT HAND Performed at [facility], [place] [address], [place], [place] [address]    Special Requests      BOTTLES DRAWN AEROBIC AND ANAEROBIC Blood Culture results may not be optimal due to an excessive volume of blood received in culture bottles Performed at [facility], [address] [place], [place] [address]    Culture  Setup Time      GRAM POSITIVE COCCI IN BOTH AEROBIC AND ANAEROBIC BOTTLES CRITICAL RESULT CALLED TO,  READ BACK BY AND VERIFIED WITH: NATE COOKSON _0  10/01/17 [facility]    Culture (A)     STAPHYLOCOCCUS SPECIES (COAGULASE NEGATIVE) CULTURE REINCUBATED FOR BETTER GROWTH Performed at [facility], [address], [place], [place] [address]    Report Status PENDING   Blood Culture ID Panel (Reflexed)     Status: Abnormal   Collection Time: 09/30/17  5:32 PM  Result Value Ref Range   Enterococcus species NOT DETECTED NOT DETECTED   Listeria monocytogenes NOT DETECTED NOT DETECTED   Staphylococcus species DETECTED (A) NOT DETECTED    Comment: Methicillin (oxacillin) susceptible coagulase negative staphylococcus. Possible blood culture contaminant (unless isolated from more than one blood culture draw or clinical case suggests pathogenicity). No antibiotic treatment is indicated for blood  culture contaminants. CRITICAL RESULT CALLED TO, READ BACK BY AND VERIFIED WITH: NATE COOKSON _1  10/01/17 [facility]    Staphylococcus aureus NOT DETECTED NOT DETECTED   Methicillin resistance NOT DETECTED NOT DETECTED   Streptococcus species NOT DETECTED NOT DETECTED   Streptococcus agalactiae NOT DETECTED NOT DETECTED   Streptococcus pneumoniae NOT DETECTED NOT DETECTED   Streptococcus pyogenes NOT DETECTED NOT DETECTED   Acinetobacter baumannii NOT DETECTED NOT DETECTED   Enterobacteriaceae species NOT DETECTED NOT DETECTED   Enterobacter cloacae complex NOT DETECTED NOT DETECTED   Escherichia coli NOT DETECTED NOT DETECTED   Klebsiella oxytoca NOT DETECTED NOT DETECTED   Klebsiella pneumoniae NOT DETECTED NOT DETECTED   Proteus species NOT DETECTED NOT DETECTED   Serratia marcescens NOT DETECTED NOT DETECTED   Haemophilus influenzae NOT DETECTED NOT DETECTED   Neisseria meningitidis  NOT DETECTED NOT DETECTED   Pseudomonas aeruginosa NOT DETECTED NOT DETECTED   Candida albicans NOT DETECTED NOT DETECTED   Candida glabrata NOT DETECTED NOT DETECTED   Candida krusei NOT DETECTED NOT DETECTED   Candida  parapsilosis NOT DETECTED NOT DETECTED   Candida tropicalis NOT DETECTED NOT DETECTED    Comment: Performed at [facility], [address] [place], [place] [address]  Blood culture (routine x 2)     Status: None (Preliminary result)   Collection Time: 09/30/17  5:33 PM  Result Value Ref Range   Specimen Description BLOOD RIGHT ANTECUBITAL    Special Requests      BOTTLES DRAWN AEROBIC AND ANAEROBIC Blood Culture adequate volume Performed at [facility], [address] [place], [place] [address]    Culture  Setup Time      GRAM POSITIVE COCCI AEROBIC BOTTLE ONLY CRITICAL VALUE NOTED.  VALUE IS CONSISTENT WITH PREVIOUSLY REPORTED AND CALLED VALUE.    Culture GRAM POSITIVE COCCI    Report Status PENDING   Lactic acid, plasma     Status: None   Collection Time: 09/30/17  9:50 PM  Result Value Ref Range   Lactic Acid, Venous 0.9 0.5 - 1.9 mmol/L    Comment: Performed at [facility], [address] [place], [place] [address]  Aerobic/Anaerobic Culture (surgical/deep wound)     Status: None (Preliminary result)   Collection Time: 09/30/17 11:18 PM  Result Value Ref Range   Specimen Description      FOOT Performed at [facility], [address], [place], [place] [address]    Special Requests      NONE Performed at [facility], [place], [place] [address]    Gram Stain      MODERATE WBC PRESENT, PREDOMINANTLY PMN ABUNDANT GRAM POSITIVE COCCI MODERATE GRAM NEGATIVE RODS    Culture      CULTURE REINCUBATED FOR BETTER GROWTH Performed at [facility], [place] [address] [place], [place] [address]    Report Status PENDING   Glucose, capillary     Status: Abnormal   Collection Time: 09/30/17 11:20 PM  Result Value Ref Range   Glucose-Capillary 133 (H) 65 - 99 mg/dL  Urinalysis, Routine w reflex microscopic     Status: Abnormal   Collection Time: 10/01/17  6:30 AM  Result Value Ref Range   Color, Urine  YELLOW (A) YELLOW   APPearance CLOUDY (A) CLEAR   Specific Gravity, Urine 1.018 1.005 - 1.030   pH 5.0 5.0 - 8.0   Glucose, UA NEGATIVE NEGATIVE mg/dL   Hgb urine dipstick MODERATE (A) NEGATIVE   Bilirubin Urine NEGATIVE NEGATIVE   Ketones, ur NEGATIVE NEGATIVE mg/dL   Protein, ur NEGATIVE NEGATIVE mg/dL   Nitrite NEGATIVE NEGATIVE   Leukocytes, UA MODERATE (A) NEGATIVE   RBC / HPF 6-30 0 - 5 RBC/hpf   WBC, UA 6-30 0 - 5 WBC/hpf   Bacteria, UA NONE SEEN NONE SEEN   Squamous Epithelial / LPF 0-5 (A) NONE SEEN   Mucus PRESENT     Comment: Performed at [facility], [address], [place], [place] [address]  Urine culture     Status: None   Collection Time: 10/01/17  6:30 AM  Result Value Ref Range   Specimen Description      URINE, RANDOM Performed at [facility], [address], [place], [place] [address]    Special Requests  NONE Performed at [facility], [address] [place], [place] [address]    Culture      NO GROWTH Performed at [facility], [place] [address] [place], [place] [address]    Report Status 10/02/2017 FINAL   Glucose, capillary     Status: None   Collection Time: 10/01/17  7:34 AM  Result Value Ref Range   Glucose-Capillary 90 65 - 99 mg/dL   Comment 1 Notify RN   Glucose, capillary     Status: None   Collection Time: 10/01/17 11:36 AM  Result Value Ref Range   Glucose-Capillary 78 65 - 99 mg/dL   Comment 1 Notify RN   Glucose, capillary     Status: None   Collection Time: 10/01/17  2:39 PM  Result Value Ref Range   Glucose-Capillary 92 65 - 99 mg/dL  Glucose, capillary     Status: None   Collection Time: 10/01/17  3:51 PM  Result Value Ref Range   Glucose-Capillary 85 65 - 99 mg/dL  Glucose, capillary     Status: Abnormal   Collection Time: 10/01/17  8:58 PM  Result Value Ref Range   Glucose-Capillary 119 (H) 65 - 99 mg/dL  Glucose, capillary     Status: None   Collection Time: 10/02/17  9:27 AM   Result Value Ref Range   Glucose-Capillary 91 65 - 99 mg/dL  Glucose, capillary     Status: None   Collection Time: 10/02/17 11:53 AM  Result Value Ref Range   Glucose-Capillary 87 65 - 99 mg/dL   Comment 1 Notify RN    No components found for: ESR, C REACTIVE PROTEIN MICRO: Recent Results (from the past 720 hour(s))  Blood culture (routine x 2)     Status: Abnormal (Preliminary result)   Collection Time: 09/30/17  5:32 PM  Result Value Ref Range Status   Specimen Description   Final    BLOOD BLOOD LEFT HAND Performed at [facility], [address], [place], [place] [address]    Special Requests   Final    BOTTLES DRAWN AEROBIC AND ANAEROBIC Blood Culture results may not be optimal due to an excessive volume of blood received in culture bottles Performed at [facility], [address] [place], [place] [address]    Culture  Setup Time   Final    GRAM POSITIVE COCCI IN BOTH AEROBIC AND ANAEROBIC BOTTLES CRITICAL RESULT CALLED TO, READ BACK BY AND VERIFIED WITH: NATE COOKSON _0  10/01/17 [facility]    Culture (A)  Final    STAPHYLOCOCCUS SPECIES (COAGULASE NEGATIVE) CULTURE REINCUBATED FOR BETTER GROWTH Performed at [facility], [place] [address], [place],  [address]    Report Status PENDING  Incomplete  Blood Culture ID Panel (Reflexed)     Status: Abnormal   Collection Time: 09/30/17  5:32 PM  Result Value Ref Range Status   Enterococcus species NOT DETECTED NOT DETECTED Final   Listeria monocytogenes NOT DETECTED NOT DETECTED Final   Staphylococcus species DETECTED (A) NOT DETECTED Final    Comment: Methicillin (oxacillin) susceptible coagulase negative staphylococcus. Possible blood culture contaminant (unless isolated from more than one blood culture draw or clinical case suggests pathogenicity). No antibiotic treatment is indicated for blood  culture contaminants. CRITICAL RESULT CALLED TO, READ BACK BY AND VERIFIED WITH: NATE COOKSON _1  10/01/17  [facility]    Staphylococcus aureus NOT DETECTED NOT DETECTED Final   Methicillin resistance NOT DETECTED NOT DETECTED Final  Streptococcus species NOT DETECTED NOT DETECTED Final   Streptococcus agalactiae NOT DETECTED NOT DETECTED Final   Streptococcus pneumoniae NOT DETECTED NOT DETECTED Final   Streptococcus pyogenes NOT DETECTED NOT DETECTED Final   Acinetobacter baumannii NOT DETECTED NOT DETECTED Final   Enterobacteriaceae species NOT DETECTED NOT DETECTED Final   Enterobacter cloacae complex NOT DETECTED NOT DETECTED Final   Escherichia coli NOT DETECTED NOT DETECTED Final   Klebsiella oxytoca NOT DETECTED NOT DETECTED Final   Klebsiella pneumoniae NOT DETECTED NOT DETECTED Final   Proteus species NOT DETECTED NOT DETECTED Final   Serratia marcescens NOT DETECTED NOT DETECTED Final   Haemophilus influenzae NOT DETECTED NOT DETECTED Final   Neisseria meningitidis NOT DETECTED NOT DETECTED Final   Pseudomonas aeruginosa NOT DETECTED NOT DETECTED Final   Candida albicans NOT DETECTED NOT DETECTED Final   Candida glabrata NOT DETECTED NOT DETECTED Final   Candida krusei NOT DETECTED NOT DETECTED Final   Candida parapsilosis NOT DETECTED NOT DETECTED Final   Candida tropicalis NOT DETECTED NOT DETECTED Final    Comment: Performed at [facility], [address] [place], [place] [address]  Blood culture (routine x 2)     Status: None (Preliminary result)   Collection Time: 09/30/17  5:33 PM  Result Value Ref Range Status   Specimen Description BLOOD RIGHT ANTECUBITAL  Final   Special Requests   Final    BOTTLES DRAWN AEROBIC AND ANAEROBIC Blood Culture adequate volume Performed at [facility], [address] [place], [place] [address]    Culture  Setup Time   Final    GRAM POSITIVE COCCI AEROBIC BOTTLE ONLY CRITICAL VALUE NOTED.  VALUE IS CONSISTENT WITH PREVIOUSLY REPORTED AND CALLED VALUE.    Culture GRAM POSITIVE COCCI  Final   Report Status PENDING   Incomplete  Aerobic/Anaerobic Culture (surgical/deep wound)     Status: None (Preliminary result)   Collection Time: 09/30/17 11:18 PM  Result Value Ref Range Status   Specimen Description   Final    FOOT Performed at [facility], [address], [place], [place] [address]    Special Requests   Final    NONE Performed at [facility], [address] [place], [place] [address]    Gram Stain   Final    MODERATE WBC PRESENT, PREDOMINANTLY PMN ABUNDANT GRAM POSITIVE COCCI MODERATE GRAM NEGATIVE RODS    Culture   Final    CULTURE REINCUBATED FOR BETTER GROWTH Performed at [facility], [place] [address], [place], [place] [address]    Report Status PENDING  Incomplete  Urine culture     Status: None   Collection Time: 10/01/17  6:30 AM  Result Value Ref Range Status   Specimen Description   Final    URINE, RANDOM Performed at [facility], [address] [place], [place] [address]    Special Requests   Final    NONE Performed at [facility], [address] [place], [place] [address]    Culture   Final    NO GROWTH Performed at [facility], [place] [address], [place], [place] [address]    Report Status 10/02/2017 FINAL  Final    IMAGING: Dg Ankle Complete Left  Result Date: 09/30/2017 CLINICAL DATA:  Bilateral leg pain progressively worsened over the past few months. Stasis dermatitis. Open laceration to bottom of right foot. EXAM: LEFT ANKLE COMPLETE - 3+ VIEW COMPARISON:  None. FINDINGS: Evaluation of osseous detail is  limited by body habitus. Bone mineralization is grossly normal. Alignment appears normal. No acute or suspicious osseous lesion identified. No focal soft tissue abnormality seen. IMPRESSION: No acute findings.  Study limitations detailed above. Electronically Signed   By: Franki Cabot M.D.   On: 09/30/2017 18:05   Dg Ankle Complete Right  Result Date: 09/30/2017 CLINICAL DATA:  Bilateral leg pain that has  progressively worsened over the past few months. Pt reports she has stasis dermatitis. Pt has an open laceration to the bottom right foot. EXAM: RIGHT ANKLE - COMPLETE 3+ VIEW COMPARISON:  None. FINDINGS: Evaluation of osseous detail is limited by patient body habitus. Bone mineralization is grossly normal. Osseous alignment appears normal. No acute or suspicious osseous lesions seen. No focal soft tissue abnormality identified. Subtle lucency underlying the calcaneus may be related to the given history of laceration. IMPRESSION: No acute osseous abnormality identified. Study limitations detailed above. Lucency underlying the calcaneus may related to the given history of laceration. Electronically Signed   By: Franki Cabot M.D.   On: 09/30/2017 18:06   Mr Ankle Right Wo Contrast  Result Date: 10/01/2017 CLINICAL DATA:  History of chronic stasis dermatitis. The patient developed a sore on the right heel 1 month ago with pain and drainage. EXAM: MRI OF THE RIGHT ANKLE WITHOUT CONTRAST TECHNIQUE: Multiplanar, multisequence MR imaging of the ankle was performed. No intravenous contrast was administered. COMPARISON:  None. FINDINGS: TENDONS Peroneal: Intact. Posteromedial: Intact. Anterior: Intact. Achilles: Intact. Plantar Fascia: Thickening and edema are seen in the proximal 2 cm of both the medial and lateral cords. The plantar fascia is intact. LIGAMENTS Lateral: Intact. Medial: Intact. CARTILAGE Ankle Joint: Negative. Subtalar Joints/Sinus Tarsi: Negative. Bones: There is intense marrow edema in the dorsal 4 cm of the calcaneus which is worst subjacent to a skin ulceration on the heel. No fracture is identified. T1 and T2 hypointense line in the posterior, inferior calcaneus likely represents a growth arrest line or physeal plate. Other: Intense subcutaneous edema is present about the imaged lower leg, ankle and foot. A skin wound is seen on the plantar surface of the foot. Deep and medial to the wound, there is  focal fluid measuring 2.1 cm AP by 1.1 cm craniocaudal by 2.0 cm craniocaudal compatible with phlegmon or abscess. 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 Focal fluid collection deep and medial to the skin wound is consistent with phlegmon or abscess. Edema in the proximal 2 cm of the medial and lateral cords of the plantar fascia is worrisome for infection. Intense subcutaneous edema diffusely about the imaged lower leg, ankle and foot is consistent with dependent change and/or cellulitis. Negative for septic joint. Electronically Signed   By: Inge Rise M.D.   On: 10/01/2017 08:52    Assessment:   Charlene Jackson is a 47 y.o. female with R heel osteomyelitis and abscess in setting of chronic lymphedema/venous stasis with severe bil LE venous stasis changes.  BCX + Coag neg staph.  Wound cx mixed. S/p Angiogram and debridement 10/02/16.  Recommendations Given osteomyelitis will need PICC Line and IV abx.  I discussed with patient and she agrees.  Will need wound care of the heel as well. Will repeat bcx and if negative on 1/6 can place piic line Check ESR and CRP Will decide on final abx based on culture results.  For the venous stasis changes she will need long term control of LE edema and skin  care. PReviously followed with dermatology. For now would elevate legs. At dc can place [place] on the L leg.  Thank you very much for allowing me to participate in the care of this patient. Please call with questions.   Cheral Marker. Ola Spurr, MD

## 2017-10-02 NOTE — Care Management (Signed)
Patient POD 0 Excisional Debridement soft tissue and bone right heel with antibiotic beads in place.  Patient lives at home with 49 year old daughter.  Patient states "the only money we have coming in is the money I get from my dad's life insurance policy".  Patient is unable to given any other financial information.  Patient states that her brother (who does not live in the home) manages all of the finances, provides groceries, and provides transportation.  Patient does not have PCP, nor a pharmacy that she uses.  Patient [facility].  Information to added AVS.  Patient states that the only medical equipment in the home is a transfer bench. PT consult pending for 10/03/17.  Per podiatry patient will likely need PICC with IV antibiotics.  Per Tommie ArdAmy Shepard with Financial Counseling patient does not meet criteria for [place] Medicaid.  Per Amy, patient's daughter [facility] has [facility], however she turns 18 next week.  [facility]  Anticipate needs for RN, wound care, PT, IV antibiotics, and equipment. RNCM following.

## 2017-10-03 LAB — BASIC METABOLIC PANEL
Anion gap: 9 (ref 5–15)
BUN: 13 mg/dL (ref 6–20)
CALCIUM: 8.8 mg/dL — AB (ref 8.9–10.3)
CO2: 27 mmol/L (ref 22–32)
CREATININE: 0.89 mg/dL (ref 0.44–1.00)
Chloride: 103 mmol/L (ref 101–111)
GFR calc non Af Amer: >60 mL/min (ref >60)
Glucose, Bld: 91 mg/dL (ref 65–99)
Potassium: 3.8 mmol/L (ref 3.5–5.1)
Sodium: 139 mmol/L (ref 135–145)

## 2017-10-03 LAB — IRON AND TIBC
IRON: 18 ug/dL — AB (ref 28–170)
Saturation Ratios: 8 % — ABNORMAL LOW (ref 10.4–31.8)
TIBC: 221 ug/dL — AB (ref 250–450)
UIBC: 203 ug/dL

## 2017-10-03 LAB — GLUCOSE, CAPILLARY
GLUCOSE-CAPILLARY: 93 mg/dL (ref 65–99)
Glucose-Capillary: 127 mg/dL — ABNORMAL HIGH (ref 65–99)
Glucose-Capillary: 82 mg/dL (ref 65–99)
Glucose-Capillary: 121 mg/dL — ABNORMAL HIGH (ref 65–99)

## 2017-10-03 LAB — CBC
HEMATOCRIT: 24.5 % — AB (ref 35.0–47.0)
Hemoglobin: 7.7 g/dL — ABNORMAL LOW (ref 12.0–16.0)
MCH: 17.1 pg — ABNORMAL LOW (ref 26.0–34.0)
MCHC: 31.3 g/dL — AB (ref 32.0–36.0)
MCV: 54.7 fL — ABNORMAL LOW (ref 80.0–100.0)
Platelets: 219 10*3/uL (ref 150–440)
RBC: 4.48 MIL/uL (ref 3.80–5.20)
RDW: 21.4 % — AB (ref 11.5–14.5)
WBC: 8.8 10*3/uL (ref 3.6–11.0)

## 2017-10-03 LAB — C-REACTIVE PROTEIN: CRP: 17 mg/dL — ABNORMAL HIGH (ref <1.0)

## 2017-10-03 LAB — VANCOMYCIN, RANDOM: Vancomycin Rm: 25

## 2017-10-03 MED ORDER — SODIUM CHLORIDE 0.9 % IV SOLN
150.0000 mg | Freq: Once | INTRAVENOUS | Status: AC
  Administered 2017-10-03: 150 mg via INTRAVENOUS
  Filled 2017-10-03: qty 7.5

## 2017-10-03 MED ORDER — FERROUS SULFATE 325 (65 FE) MG PO TABS
325.0000 mg | ORAL_TABLET | Freq: Two times a day (BID) | ORAL | Status: DC
  Administered 2017-10-03 – 2017-10-07 (×9): 325 mg via ORAL
  Filled 2017-10-03 (×9): qty 1

## 2017-10-03 MED ORDER — VANCOMYCIN HCL IN DEXTROSE 1-5 GM/200ML-% IV SOLN
1000.0000 mg | Freq: Two times a day (BID) | INTRAVENOUS | Status: DC
  Administered 2017-10-03 – 2017-10-04 (×4): 1000 mg via INTRAVENOUS
  Filled 2017-10-03 (×6): qty 200

## 2017-10-03 NOTE — Progress Notes (Addendum)
[facility] [place] [facility]   PATIENT NAME: Charlene Jackson    MR#:  [id number]  DATE OF BIRTH:  Aug 22, 1971  SUBJECTIVE:  CHIEF COMPLAINT:   Chief Complaint  Patient presents with  . Leg Pain   Have severe lymphedema and bilateral leg swelling, came with worsening pain and redness with some discharge from right heel. Found to have osteomyelitis on MRI. Vascular angiogram on the right lower extremity was done and did not show any abnormality. Status post podiatry procedure again with some debridement of bone. Complaining of right-sided facial swelling.  REVIEW OF SYSTEMS:  CONSTITUTIONAL: No fever, positive for fatigue or weakness.  EYES: No blurred or double vision.  EARS, NOSE, AND THROAT: No tinnitus or ear pain.  RESPIRATORY: No cough, shortness of breath, wheezing or hemoptysis.  CARDIOVASCULAR: No chest pain, orthopnea, edema.  GASTROINTESTINAL: No nausea, vomiting, diarrhea or abdominal pain.  GENITOURINARY: No dysuria, hematuria.  ENDOCRINE: No polyuria, nocturia,  HEMATOLOGY: No anemia, easy bruising or bleeding SKIN: Chronic skin changes due to lymphedema and venous stasis on both legs. MUSCULOSKELETAL: No joint pain or arthritis.   NEUROLOGIC: No tingling, numbness, weakness.  PSYCHIATRY: No anxiety or depression.   ROS  DRUG ALLERGIES:  No Known Allergies  VITALS:  Blood pressure (!) 137/47, pulse 97, temperature 97.9 F (36.6 C), temperature source Oral, resp. rate 20, height 5\' 7"  (1.702 m), weight 127 kg (280 lb), last menstrual period 10/01/2017, SpO2 99 %.  PHYSICAL EXAMINATION:   GENERAL:  47 y.o.-year-old patient lying in the bed with no acute distress.  Obese EYES: Pupils equal, round, reactive to light and accommodation. No scleral icterus. Extraocular muscles intact.  HEENT: Head atraumatic, normocephalic. Oropharynx and nasopharynx clear. Right side face swelling, non tender, non fluctuation. NECK:  Supple, no jugular venous  distention. No thyroid enlargement, no tenderness.  LUNGS: Normal breath sounds bilaterally, no wheezing, rales,rhonchi or crepitation. No use of accessory muscles of respiration.  CARDIOVASCULAR: S1, S2 normal. No murmurs, rubs, or gallops.  ABDOMEN: Soft, nontender, nondistended. Bowel sounds present. No organomegaly or mass.  EXTREMITIES: Skin is warm there is swelling redness and pus with blood drainage from the right foot.  She has impressive excessive skin/plaque buildup to bilateral feet and up to the mid tibial shin, chronic changes of lymphedema and venous stasis on both legs. S/p surgical dressing on right foot. NEUROLOGIC: Grossly intact unable to assess secondary to patient's severe body posture. pSYCHIATRIC: The patient is alert and oriented x 3.  SKIN: No obvious rash, lesion, or ulcer.    Physical Exam LABORATORY PANEL:   CBC Recent Labs  Lab 10/03/17 0537  WBC 8.8  HGB 7.7*  HCT 24.5*  PLT 219   ------------------------------------------------------------------------------------------------------------------  Chemistries  Recent Labs  Lab 09/30/17 1658  10/03/17 0537  NA 135   < > 139  K 4.0   < > 3.8  CL 98*   < > 103  CO2 28   < > 27  GLUCOSE 144*   < > 91  BUN 22*   < > 13  CREATININE 0.81   < > 0.89  CALCIUM 9.6   < > 8.8*  AST 45*  --   --   ALT 35  --   --   ALKPHOS 197*  --   --   BILITOT 0.3  --   --    < > = values in this interval not displayed.   ------------------------------------------------------------------------------------------------------------------  Cardiac Enzymes No results  for input(s): TROPONINI in the last 168 hours. ------------------------------------------------------------------------------------------------------------------  RADIOLOGY:  No results found.  ASSESSMENT AND PLAN:   Active Problems:   Sepsis (HCC)  Charlene Jackson with past medical history of stasis dermatitis both lower extremity  presents to the emergency room department for worsening right foot pain and worsening bilateral lower extremity swelling with fungating skin infection along with significant amount of drainage from the right heel.  Patient says at baseline her legs have been swollen with black/brown scaly skin that he relates on her feet falls off on its own.  She used to follow-up with a dermatologist however has lost to do so in many months.  1.  Sepsis secondary to bilateral lower extremity cellulitis, right heel osteomyelitis  significant wound infection foul-smelling right foot more than the left -Patient has severe amount of drainage from the right heel.  -IV Vanco and Zosyn -Podiaty, and vascular consultation -Wound consult -X-ray both feet no evidence of osteomyelitis noted on plain x-ray film- MRI confirmed OM. - Initial debridement done by podiatry. -Vascular pedal angiogram to check for circulation on the right lower extremity and it was found satisfactory 10/01/17. - Further debridement and some scrapping of bone done by podiatry on 10/02/2017 - ID consult to guide IV antibiotic therapy. - ID sent repeat bl cx and if negative until 10/04/17- then PICC line and Abx.  2.  Hyperglycemia Check A1c Sliding scale insulin  3.  Patient states she has history of thyroid problem - normal TSH  4.  DVT prophylaxis subcu Lovenox  5. Bacteremia- appears to be contamination. Follow cultures.  6. Called PT consult.  7. Iron deficiency anemia   Check iron studies and give oral iron.  8. Right facial swelling   ENT consult.  All the records are reviewed and case discussed with Care Management/Social Workerr. Management plans discussed with the patient, family and they are in agreement.  CODE STATUS: Full.  TOTAL TIME TAKING CARE OF THIS PATIENT: 35 minutes.    POSSIBLE D/C IN 1-2 DAYS, DEPENDING ON CLINICAL CONDITION.   Altamese Dilling M.D on 10/03/2017   Between 7am to 6pm - Pager -  [phone]  After 6pm go to [email or web address] - password [email or web address]  [facility]  Office  [phone]  CC: Primary care physician; Patient, No Pcp Per  Note: This dictation was prepared with Dragon dictation along with smaller phrase technology. Any transcriptional errors that result from this process are unintentional.

## 2017-10-03 NOTE — Progress Notes (Signed)
Pharmacy Antibiotic Note  Charlene Jackson is a 47 y.o. female admitted on 09/30/2017 with wound infection.  Pharmacy has been consulted for vancomycin and piperacillin/tazobactam dosing.  Plan: Continue piperacillin/tazobactam 3.375 g IV q8h EI  Patient has received several doses of current regimen of vancomycin 1500 mg IV q8h.  Will reduce dose based on kinetic calculations to vancomycin 1000 mg IV q8h and check VT at steady state. Goal VT 15-20 mcg/mL Patient is at risk of accumulation due to obesity  Kinetics: Using adjusted body weight = 87 kg, CrCl 100 mL/min Ke: 0.087 Half-life: 7.9 hrs Vd: 61 L Cmin (calculated) = 16 mcg/mL  1/4: VT @ 21:30 = 34 mcg/mL .   Level seems to be drawn appropriately ,  Toxic level is probably due to accumulation. Will hold Vanc for now and recheck SrCr.   01/04 @ 2135 Scr 1.02 >> 0.81 Scr rose by 0.21, UOP stable @ 1.2L, will hold off vanc for now and draw a random level w/ another bmp w/ am labs since Scr may still be rising.  Height: 5\' 7"  (170.2 cm) Weight: 280 lb (127 kg) IBW/kg (Calculated) : 61.6  Temp (24hrs), Avg:97.7 F (36.5 C), Min:97.4 F (36.3 C), Max:98.1 F (36.7 C)  Recent Labs  Lab 09/30/17 1658 09/30/17 1732 09/30/17 2150 10/02/17 2135  WBC 13.8*  --   --   --   CREATININE 0.81  --   --  1.02*  LATICACIDVEN  --  2.1* 0.9  --   VANCOTROUGH  --   --   --  34*    Estimated Creatinine Clearance: 93.5 mL/min (A) (by C-G formula based on SCr of 1.02 mg/dL (H)).    Not on File  Micro:  1/2 Blood: No growth < 24 hours 1/3 Urine: Sent  Thank you for allowing pharmacy to be a part of this patient's care.  Thomasene Rippleavid  Joeanthony Seeling, PharmD Clinical Pharmacist 10/03/2017 12:04 AM

## 2017-10-03 NOTE — Progress Notes (Signed)
   10/03/17 1457  PT Visit Information  Last PT Received On 10/03/17  Reason Eval/Treat Not Completed Fatigue/lethargy limiting ability to participate;Other (comment) (attempted PT eval x2 this afternoon; patient refused both time stating, "I have not slept in two days. I cannot get up. My nurse should have put a do not disturb sign on my door." Will re-attempt when patient willing to participate; )  In addition, hemaglobin is 7.7; hopefully that will improve tomorrow when eval is re-attempted;  Thank you for this referral. Cornell BarmanMargaret E Trotter, PT, DPT 2:59 PM

## 2017-10-03 NOTE — Progress Notes (Signed)
[facility]  Daily Progress Note  Patient underwent a right lower extremity angiogram on October 01, 2017 which was notable for normal right common femoral artery, profunda femoris artery, and superficial femoral artery which was continuous down to a normal popliteal artery without significant stenosis.  There was then a normal tibial trifurcation and three-vessel runoff distally without focal stenosis identified.  At this time vascular surgery will sign off please reconsult if necessary.  Cleda DaubKimberly Montay Vanvoorhis PA-C 10/03/2017 5:26 PM

## 2017-10-03 NOTE — Progress Notes (Signed)
Pharmacy Antibiotic Note  Charlene Jackson is a 47 y.o. female admitted on 09/30/2017 with wound infection.  Pharmacy has been consulted for vancomycin and piperacillin/tazobactam dosing.  Plan: Continue piperacillin/tazobactam 3.375 g IV q8h EI  Patient has received several doses of current regimen of vancomycin 1500 mg IV q8h.  Will reduce dose based on kinetic calculations to vancomycin 1000 mg IV q8h and check VT at steady state. Goal VT 15-20 mcg/mL Patient is at risk of accumulation due to obesity  Kinetics: Using adjusted body weight = 87 kg, CrCl 100 mL/min Ke: 0.087 Half-life: 7.9 hrs Vd: 61 L Cmin (calculated) = 16 mcg/mL  1/4: VT @ 21:30 = 34 mcg/mL .   Level seems to be drawn appropriately ,  Toxic level is probably due to accumulation. Will hold Vanc for now and recheck SrCr.   01/04 @ 2135 Scr 1.02 >> 0.81 Scr rose by 0.21, UOP stable @ 1.2L, will hold off vanc for now and draw a random level w/ another bmp w/ am labs since Scr may still be rising.  1/5: Random vanc level 1/5 at 0537 = 25, Scr back down to 0.89.  Two level kinetics suggests Ke of 0.038 with half life of 18, but initial high trough likely partly due to high 1500 mg IV q8h x2 doses. Will change dose to vancomycin 1000 mg IV q12h about when level expected to be ~20. Will order trough prior to 4th dose. VT 1/6 at 2330. Need to continue to watch renal function.   Height: 5\' 7"  (170.2 cm) Weight: 280 lb (127 kg) IBW/kg (Calculated) : 61.6  Temp (24hrs), Avg:97.7 F (36.5 C), Min:97.4 F (36.3 C), Max:98.1 F (36.7 C)  Recent Labs  Lab 09/30/17 1658 09/30/17 1732 09/30/17 2150 10/02/17 2135 10/03/17 0537  WBC 13.8*  --   --   --  8.8  CREATININE 0.81  --   --  1.02* 0.89  LATICACIDVEN  --  2.1* 0.9  --   --   VANCOTROUGH  --   --   --  34*  --   VANCORANDOM  --   --   --   --  25    Estimated Creatinine Clearance: 107.1 mL/min (by C-G formula based on SCr of 0.89 mg/dL).    No Known  Allergies  Micro:  1/2 Blood: coag neg staph/pending 1/3 Urine: NG 1/2 WCx pending 1/4 WCx pending 1/4 BCx NGTD x1   Thank you for allowing pharmacy to be a part of this patient's care.  Marty HeckWang, Yashica Sterbenz L, PharmD Clinical Pharmacist 10/03/2017 10:14 AM

## 2017-10-03 NOTE — Progress Notes (Signed)
Dr Tobi BastosPyreddy notified of swelling in pt's right jaw, has increased during shift, no orders given.

## 2017-10-03 NOTE — Progress Notes (Signed)
[facility] called to notified that something is growing from the culture that was obtain in the OR yesterday from the pt's right heel. They also said they will have more information of what is growing by tomorrow. Also notified Dr Elisabeth PigeonVachhani. No new orders given at this point.

## 2017-10-03 NOTE — Progress Notes (Signed)
1 Day Post-Op   Subjective/Chief Complaint: Patient seen. Denies any pain at this point.Patient seen.  Denies any pain at this point.   Objective: Vital signs in last 24 hours: Temp:  [97.4 F (36.3 C)-98.1 F (36.7 C)] 97.9 F (36.6 C) (01/05 0503) Pulse Rate:  [83-99] 97 (01/05 0503) Resp:  [20] 20 (01/05 0503) BP: (112-137)/(42-60) 137/47 (01/05 0503) SpO2:  [92 %-99 %] 99 % (01/05 0503) Last BM Date: 09/30/17  Intake/Output from previous day: 01/04 0701 - 01/05 0700 In: 1447.9 [P.O.:480; I.V.:700; IV Piggyback:267.9] Out: 2000 [Urine:1900; Blood:100] Intake/Output this shift: Total I/O In: 40 [IV Piggyback:40] Out: -   Moderate bleeding is noted on the bandaging with a couple small areas of some drainage with minimal purulence.  Cellulitis and edema significantly reduced in the right lower extremity. Cellulitis and edema significantly reduced in the right lower extremity.  Lab Results:  Recent Labs    09/30/17 1658 10/03/17 0537  WBC 13.8* 8.8  HGB 9.5* 7.7*  HCT 30.2* 24.5*  PLT 289 219   BMET Recent Labs    10/02/17 2135 10/03/17 0537  NA 138 139  K 3.9 3.8  CL 103 103  CO2 27 27  GLUCOSE 118* 91  BUN 14 13  CREATININE 1.02* 0.89  CALCIUM 8.4* 8.8*   PT/INR No results for input(s): LABPROT, INR in the last 72 hours. ABG No results for input(s): PHART, HCO3 in the last 72 hours.  Invalid input(s): PCO2, PO2  Studies/Results: No results found.  Anti-infectives: Anti-infectives (From admission, onward)   Start     Dose/Rate Route Frequency Ordered Stop   10/03/17 1200  vancomycin (VANCOCIN) IVPB 1000 mg/200 mL premix     1,000 mg 200 mL/hr over 60 Minutes Intravenous Every 12 hours 10/03/17 1014     10/01/17 1400  vancomycin (VANCOCIN) IVPB 1000 mg/200 mL premix  Status:  Discontinued     1,000 mg 200 mL/hr over 60 Minutes Intravenous Every 8 hours 10/01/17 1047 10/02/17 2328   10/01/17 1210  cefUROXime (ZINACEF) 1.5 g in dextrose 5 % 50 mL  IVPB  Status:  Discontinued     1.5 g 100 mL/hr over 30 Minutes Intravenous 30 min pre-op 10/01/17 1210 10/01/17 1556   10/01/17 0400  piperacillin-tazobactam (ZOSYN) IVPB 3.375 g     3.375 g 12.5 mL/hr over 240 Minutes Intravenous Every 8 hours 09/30/17 1933     09/30/17 2200  vancomycin (VANCOCIN) 1,500 mg in sodium chloride 0.9 % 500 mL IVPB  Status:  Discontinued     1,500 mg 250 mL/hr over 120 Minutes Intravenous Every 8 hours 09/30/17 1939 10/01/17 1044   09/30/17 1715  vancomycin (VANCOCIN) IVPB 1000 mg/200 mL premix     1,000 mg 200 mL/hr over 60 Minutes Intravenous  Once 09/30/17 1714 09/30/17 1905   09/30/17 1715  piperacillin-tazobactam (ZOSYN) IVPB 3.375 g     3.375 g 100 mL/hr over 30 Minutes Intravenous  Once 09/30/17 1714 09/30/17 1825      Assessment/Plan: s/p Procedure(s): IRRIGATION AND DEBRIDEMENT FOOT (Right) Assessment: Stable status post debridement right heel.   Plan: Dry sterile dressing reapplied to the right foot and ankle.  Discussed with the patient that at this point she should be stable for discharge  in a couple of day once we can get her on the appropriate antibiotics.  Discussed with the patient weightbearing on the forefoot only for transfers with the use of a walker. She will need home health care for dressing changes  and I will follow up with her once a week outpatient. Plan for dressing change on Monday prior to discharge if she is stable  LOS: 3 days    Ricci Barker 10/03/2017

## 2017-10-04 LAB — CULTURE, BLOOD (ROUTINE X 2): Special Requests: ADEQUATE

## 2017-10-04 LAB — GLUCOSE, CAPILLARY
GLUCOSE-CAPILLARY: 97 mg/dL (ref 65–99)
GLUCOSE-CAPILLARY: 104 mg/dL — AB (ref 65–99)
GLUCOSE-CAPILLARY: 110 mg/dL — AB (ref 65–99)
Glucose-Capillary: 101 mg/dL — ABNORMAL HIGH (ref 65–99)

## 2017-10-04 MED ORDER — CHLORHEXIDINE GLUCONATE 0.12 % MT SOLN
15.0000 mL | Freq: Four times a day (QID) | OROMUCOSAL | Status: DC
  Administered 2017-10-04 – 2017-10-06 (×12): 15 mL via OROMUCOSAL
  Filled 2017-10-04 (×12): qty 15

## 2017-10-04 NOTE — Evaluation (Signed)
Physical Therapy Evaluation Patient Details Name: Charlene Jackson MRN: [id number] DOB: 04-11-1971 Today's Date: 10/04/2017   History of Present Illness  47 yo Female came to ED with increased swelling and with fungating skin infection along with significant amount of drainage from the right heel; patient is s/p debridement of RLE heel for osteomyelitis; PMH significant for DM;   Clinical Impression  47 yo Female came to ED with BLE cellulitis and wound infection in right heel; Patient is obese and does not care for self well. She lives at home with her 58 year old daughter and patient's brother; She reports not using any assistive device for ambulation at baseline, but was limited to short distances. She was sleeping in a chair due to severe back pain; Patient lives in an apartment that is on the first floor. She is currently mod A +2 for bed mobility and transfers. She requires mod A +2 for taking a few steps to bedside chair with RW. Patient very slow moving and requires increased time and instruction for task. She is often distracted. Patient exhibits significant weakness in BLE. She exhibits poor standing balance. She is limited to toe touch weight bearing in RLE and WBAT in LLE; She would benefit from skilled PT intervention to improve strength and mobility; One concern is patient participation. She refused therapy yesterday and required several attempts today; She is not safe to go back home at this time due to limited mobility; patient requires mod A+2 for all ADLs and it is unclear if she will have adequate caregiver support for this. In addition patient would not be able to walk household distances. Consider SNF/rehab upon discharge. At minimum she will need 24 hour supervision/assistance;     Follow Up Recommendations SNF;Supervision/Assistance - 24 hour    Equipment Recommendations  Rolling walker with 5" wheels;Other (comment)(will need RW (consider bariatric RW), and bedside commode if  going home; )    Recommendations for Other Services       Precautions / Restrictions Precautions Precautions: Fall Restrictions Weight Bearing Restrictions: Yes RLE Weight Bearing: Touchdown weight bearing Other Position/Activity Restrictions: RLE is toe touch weight bearing only; LLE is WBAT      Mobility  Bed Mobility Overal bed mobility: Needs Assistance Bed Mobility: Supine to Sit     Supine to sit: Min assist;+2 for safety/equipment     General bed mobility comments: able to slide LE off bed; did need assistance for bringing trunk forward into sitting; cues for hand placement on bed rails; elevated head of bed; required +2 for safety and to help with positioning;   Transfers Overall transfer level: Needs assistance Equipment used: Rolling walker (2 wheeled) Transfers: Sit to/from Stand Sit to Stand: Mod assist;+2 physical assistance         General transfer comment: from bed; with cues for foot placement (advance RLE to reduce weight bearing; required 2 attempts before standing;   Ambulation/Gait Ambulation/Gait assistance: Mod assist;+2 physical assistance Ambulation Distance (Feet): 3 Feet Assistive device: Rolling walker (2 wheeled) Gait Pattern/deviations: Step-to pattern;Decreased stance time - right;Decreased step length - left;Decreased weight shift to right;Antalgic;Trunk flexed Gait velocity: decreased   General Gait Details: took a few steps to bedside chair; required increased time; mod A +2 for safety; required cues for walker placement and foot placement; did wear off loading shoe on RLE;   Stairs            Wheelchair Mobility    Modified Rankin (Stroke Patients Only)  Balance Overall balance assessment: Needs assistance Sitting-balance support: Feet supported;Bilateral upper extremity supported Sitting balance-Leahy Scale: Fair     Standing balance support: Bilateral upper extremity supported;During functional  activity Standing balance-Leahy Scale: Poor                               Pertinent Vitals/Pain Pain Assessment: 0-10 Pain Score: 7  Pain Location: right foot Pain Descriptors / Indicators: Sore;Shooting Pain Intervention(s): Limited activity within patient's tolerance;Repositioned;Monitored during session    Home Living Family/patient expects to be discharged to:: Private residence Living Arrangements: Children;Other relatives(daughter and brother at home; ) Available Help at Discharge: Family Type of Home: Apartment Home Access: Level entry     Home Layout: One level Home Equipment: Tub bench      Prior Function Level of Independence: Needs assistance   Gait / Transfers Assistance Needed: was walking without assistive device  ADL's / Homemaking Assistance Needed: daughter and brother helped her with house cleaning and cooking; patient reports having a hard time taking a bath as she couldnt' lift her legs into the tub; slept in recliner chair;         Hand Dominance        Extremity/Trunk Assessment   Upper Extremity Assessment Upper Extremity Assessment: Generalized weakness    Lower Extremity Assessment Lower Extremity Assessment: RLE deficits/detail;LLE deficits/detail RLE Deficits / Details: intact light touch above knee; not tested below knee due to wounds; ROM is limited against gravity due to weakness; hip: grossly 2/5, knee: 3-/5, ankle 3-/5 LLE Deficits / Details: intact light touch above knee; not tested below knee due to wounds; ROM is limited against gravity due to weakness; hip: grossly 2/5, knee: 3-/5, ankle 3-/5    Cervical / Trunk Assessment Cervical / Trunk Assessment: Kyphotic  Communication   Communication: No difficulties  Cognition Arousal/Alertness: Awake/alert Behavior During Therapy: WFL for tasks assessed/performed Overall Cognitive Status: Within Functional Limits for tasks assessed                                  General Comments: does get distracted easily requiring cues to get back on task;       General Comments General comments (skin integrity, edema, etc.): has redness and scaling along BLE lower legs with increased swelling noted; multiple open spots/drainage from wounds;     Exercises Other Exercises Other Exercises: Instructed patient in sitting LE exercise: ankle pump x10, quad set x10, gluteal squeeze x10 reps; with cues to increase ROM to tolerance for better ROM and flexibility; instructed patient to do all exercise 10 reps, every 10 min for muscle activation; utilized teach back for understanding;    Assessment/Plan    PT Assessment Patient needs continued PT services  PT Problem List Decreased strength;Pain;Decreased range of motion;Decreased activity tolerance;Decreased knowledge of use of DME;Decreased balance;Decreased safety awareness;Obesity;Decreased mobility;Decreased knowledge of precautions       PT Treatment Interventions DME instruction;Therapeutic activities;Gait training;Therapeutic exercise;Patient/family education;Balance training;Functional mobility training    PT Goals (Current goals can be found in the Care Plan section)  Acute Rehab PT Goals Patient Stated Goal: "I want to get stronger" PT Goal Formulation: With patient Time For Goal Achievement: 10/18/17 Potential to Achieve Goals: Fair    Frequency Min 2X/week   Barriers to discharge Decreased caregiver support level entry; has daughter and brother at home; however patient is mod  A +2 in hospital for all ADLs; she is unable to clean herself after using the bedpan; unable to walk >3 feet; concerned about her safety at home due to lack of mobility and severity of symptoms;     Co-evaluation               AM-PAC PT "6 Clicks" Daily Activity  Outcome Measure Difficulty turning over in bed (including adjusting bedclothes, sheets and blankets)?: Unable Difficulty moving from lying on back to  sitting on the side of the bed? : Unable Difficulty sitting down on and standing up from a chair with arms (e.g., wheelchair, bedside commode, etc,.)?: Unable Help needed moving to and from a bed to chair (including a wheelchair)?: A Lot Help needed walking in hospital room?: A Lot Help needed climbing 3-5 steps with a railing? : Total 6 Click Score: 8    End of Session Equipment Utilized During Treatment: Gait belt Activity Tolerance: Patient limited by fatigue;Patient limited by pain Patient left: in chair;with chair alarm set;with call bell/phone within reach;with nursing/sitter in room Nurse Communication: Mobility status PT Visit Diagnosis: Unsteadiness on feet (R26.81);Muscle weakness (generalized) (M62.81)    Time: (went in room 2 different times (2:15-2:35PM) and 3:20-3:35PM))-      Charges:   PT Evaluation $PT Eval Moderate Complexity: 1 Mod PT Treatments $Therapeutic Exercise: 8-22 mins   PT G Codes:          Baelynn Schmuhl PT, DPT 10/04/2017, 4:04 PM

## 2017-10-04 NOTE — Progress Notes (Signed)
Dressing to right foot had no need to be reinforced during the shift.

## 2017-10-04 NOTE — Consult Note (Signed)
..   Charlene Jackson, Charlene Jackson [id number] July 23, 1971 Charlene Jackson, Charlene Jackson, *  Reason for Consult: right sided facial swelling  HPI: 47 y.o. Female with history of osteomyelitis admitted for wound care.  Developed right sided facial swelling.  Reports very poor dental history and patient reports that she does not brush her teeth.  Denies any right sided facial pain.  No dysphonia/dysphagia.  Stable since yesterday.  Currently on Zosyn and Vancomycin.  Reports xerostomia as well for the last several months.  Allergies: No Known Allergies  ROS: Review of systems normal other than 12 systems except per HPI.  PMH: History reviewed. No pertinent past medical history.  FH: History reviewed. No pertinent family history.  SH:  Social History   Socioeconomic History  . Marital status: Legally Separated    Spouse name: Not on file  . Number of children: Not on file  . Years of education: Not on file  . Highest education level: Not on file  Social Needs  . Financial resource strain: Not on file  . Food insecurity - worry: Not on file  . Food insecurity - inability: Not on file  . Transportation needs - medical: Not on file  . Transportation needs - non-medical: Not on file  Occupational History  . Not on file  Tobacco Use  . Smoking status: Former Smoker    Last attempt to quit: 09/29/2017    Years since quitting: 0.0  . Smokeless tobacco: Never Used  Substance and Sexual Activity  . Alcohol use: Not on file  . Drug use: Not on file  . Sexual activity: Not on file  Other Topics Concern  . Not on file  Social History Narrative  . Not on file    PSH:  Past Surgical History:  Procedure Laterality Date  . IRRIGATION AND DEBRIDEMENT FOOT Right 10/02/2017   Procedure: IRRIGATION AND DEBRIDEMENT FOOT;  Surgeon: Linus Galasline, Todd, DPM;  Location: [facility] ORS;  Service: Podiatry;  Laterality: Right;  . LOWER EXTREMITY ANGIOGRAPHY N/A 10/01/2017   Procedure: LOWER EXTREMITY ANGIOGRAPHY;  Surgeon: Annice Needyew, Jason S,  MD;  Location: [facility] INVASIVE CV LAB;  Service: Cardiovascular;  Laterality: N/A;    Physical  Exam:  GEN-  Obese female supine in bed in NAD NEURO-CN 2-12 grossly intact and symmetric. EARS-  External eacs clear OC/OP-  Xerostomia, poor dentition with multiple caries and cracked teeth, firm swelling adjacent to molar on mandible that is inseperable from mandible on right body.  Floor of mouth soft.  No purulence.  No significant purulence from parotid duct with normal salivary flow with palpation NECK-  No masses or lesions, no LAD EYE- EOMI  A/P: Dental abscess  Plan:  Patient already on appropriate coverage with Zosyn and plan per patient is PICC line.  Recommend Chlorhexidine mouthwash.  Good dental hygiene.  Good hydration.  Follow up with Dentist ASAP upon discharge for evaluation of tooth and most likely extraction.  Discussed with patient that this most likely will continue to occur if dental issues not taken care of.   Aimee Timmons 10/04/2017 8:22 AM

## 2017-10-04 NOTE — Progress Notes (Signed)
   10/04/17 1145  PT Visit Information  Last PT Received On 10/04/17  Reason Eval/Treat Not Completed Patient declined, no reason specified;Other (comment) (patient states, "I can't do therapy right now. I am on the bedpan. I will try this afternoon." )  Charlene BarmanMargaret E Rehaan Jackson, PT, DPT 12:12 PM

## 2017-10-04 NOTE — Progress Notes (Signed)
[facility] [place] [facility]   PATIENT NAME: Charlene Jackson    MR#:  [id number]  DATE OF BIRTH:  September 17, 1971  SUBJECTIVE:  CHIEF COMPLAINT:   Chief Complaint  Patient presents with  . Leg Pain   Have severe lymphedema and bilateral leg swelling, came with worsening pain and redness with some discharge from right heel. Found to have osteomyelitis on MRI. Vascular angiogram on the right lower extremity was done and did not show any abnormality. Status post podiatry procedure again with some debridement of bone. Complaining of right-sided facial swelling.  REVIEW OF SYSTEMS:  CONSTITUTIONAL: No fever, positive for fatigue or weakness.  EYES: No blurred or double vision.  EARS, NOSE, AND THROAT: No tinnitus or ear pain.  RESPIRATORY: No cough, shortness of breath, wheezing or hemoptysis.  CARDIOVASCULAR: No chest pain, orthopnea, edema.  GASTROINTESTINAL: No nausea, vomiting, diarrhea or abdominal pain.  GENITOURINARY: No dysuria, hematuria.  ENDOCRINE: No polyuria, nocturia,  HEMATOLOGY: No anemia, easy bruising or bleeding SKIN: Chronic skin changes due to lymphedema and venous stasis on both legs. MUSCULOSKELETAL: No joint pain or arthritis.   NEUROLOGIC: No tingling, numbness, weakness.  PSYCHIATRY: No anxiety or depression.   ROS  DRUG ALLERGIES:  No Known Allergies  VITALS:  Blood pressure (!) 97/50, pulse 91, temperature 98.9 F (37.2 C), temperature source Oral, resp. rate 18, height 5\' 7"  (1.702 m), weight 127 kg (280 lb), last menstrual period 10/01/2017, SpO2 96 %.  PHYSICAL EXAMINATION:   GENERAL:  47 y.o.-year-old patient lying in the bed with no acute distress.  Obese EYES: Pupils equal, round, reactive to light and accommodation. No scleral icterus. Extraocular muscles intact.  HEENT: Head atraumatic, normocephalic. Oropharynx and nasopharynx clear. Right side face swelling, non tender, non fluctuation. NECK:  Supple, no jugular venous  distention. No thyroid enlargement, no tenderness.  LUNGS: Normal breath sounds bilaterally, no wheezing, rales,rhonchi or crepitation. No use of accessory muscles of respiration.  CARDIOVASCULAR: S1, S2 normal. No murmurs, rubs, or gallops.  ABDOMEN: Soft, nontender, nondistended. Bowel sounds present. No organomegaly or mass.  EXTREMITIES: Skin is warm there is swelling redness and pus with blood drainage from the right foot.  She has impressive excessive skin/plaque buildup to bilateral feet and up to the mid tibial shin, chronic changes of lymphedema and venous stasis on both legs. S/p surgical dressing on right foot. NEUROLOGIC: Grossly intact unable to assess secondary to patient's severe body posture. pSYCHIATRIC: The patient is alert and oriented x 3.  SKIN: No obvious rash, lesion, or ulcer.    Physical Exam LABORATORY PANEL:   CBC Recent Labs  Lab 10/03/17 0537  WBC 8.8  HGB 7.7*  HCT 24.5*  PLT 219   ------------------------------------------------------------------------------------------------------------------  Chemistries  Recent Labs  Lab 09/30/17 1658  10/03/17 0537  NA 135   < > 139  K 4.0   < > 3.8  CL 98*   < > 103  CO2 28   < > 27  GLUCOSE 144*   < > 91  BUN 22*   < > 13  CREATININE 0.81   < > 0.89  CALCIUM 9.6   < > 8.8*  AST 45*  --   --   ALT 35  --   --   ALKPHOS 197*  --   --   BILITOT 0.3  --   --    < > = values in this interval not displayed.   ------------------------------------------------------------------------------------------------------------------  Cardiac Enzymes No results  for input(s): TROPONINI in the last 168 hours. ------------------------------------------------------------------------------------------------------------------  RADIOLOGY:  No results found.  ASSESSMENT AND PLAN:   Active Problems:   Sepsis (HCC)  Charlene Jackson  is a 47 y.o. female with past medical history of stasis dermatitis both lower extremity  presents to the emergency room department for worsening right foot pain and worsening bilateral lower extremity swelling with fungating skin infection along with significant amount of drainage from the right heel.  Patient says at baseline her legs have been swollen with black/brown scaly skin that he relates on her feet falls off on its own.  She used to follow-up with a dermatologist however has lost to do so in many months.  1.  Sepsis secondary to bilateral lower extremity cellulitis, right heel osteomyelitis  significant wound infection foul-smelling right foot more than the left -Patient had severe amount of drainage from the right heel.  -IV Vanco and Zosyn -Podiaty, and vascular consultation -Wound consult -X-ray both feet no evidence of osteomyelitis noted on plain x-ray film- MRI confirmed OM. - Initial debridement done by podiatry. -Vascular pedal angiogram to check for circulation on the right lower extremity and it was found satisfactory 10/01/17. - Further debridement and some scrapping of bone done by podiatry on 10/02/2017 - ID consult to guide IV antibiotic therapy. - ID sent repeat bl cx and if negative until 10/04/17- then PICC line and Abx.  - PT eval.  2.  Hyperglycemia Check A1c Sliding scale insulin  3.  Patient states she has history of thyroid problem - normal TSH  4.  DVT prophylaxis subcu Lovenox  5. Bacteremia- appears to be contamination. Follow cultures.  6. Called PT consult.  7. Iron deficiency anemia   Check iron studies and give oral iron.  8. Right facial swelling   ENT consult.- Faced continue Zosyn and follow up with dentist as soon as possible on discharge.  All the records are reviewed and case discussed with Care Management/Social Workerr. Management plans discussed with the patient, family and they are in agreement.  CODE STATUS: Full.  TOTAL TIME TAKING CARE OF THIS PATIENT: 35 minutes.    POSSIBLE D/C IN 1-2 DAYS, DEPENDING ON  CLINICAL CONDITION.   Altamese Dilling M.D on 10/04/2017   Between 7am to 6pm - Pager - [phone]  After 6pm go to [email or web address] - password [email or web address]  [facility]  Office  [phone]  CC: Primary care physician; Patient, No Pcp Per  Note: This dictation was prepared with Dragon dictation along with smaller phrase technology. Any transcriptional errors that result from this process are unintentional.

## 2017-10-05 LAB — CBC
HEMATOCRIT: 24.3 % — AB (ref 35.0–47.0)
Hemoglobin: 7.6 g/dL — ABNORMAL LOW (ref 12.0–16.0)
MCH: 17.4 pg — ABNORMAL LOW (ref 26.0–34.0)
MCHC: 31.3 g/dL — ABNORMAL LOW (ref 32.0–36.0)
MCV: 55.7 fL — AB (ref 80.0–100.0)
Platelets: 274 10*3/uL (ref 150–440)
RBC: 4.37 MIL/uL (ref 3.80–5.20)
RDW: 21.4 % — ABNORMAL HIGH (ref 11.5–14.5)
WBC: 8.3 10*3/uL (ref 3.6–11.0)

## 2017-10-05 LAB — BASIC METABOLIC PANEL
ANION GAP: 7 (ref 5–15)
BUN: 13 mg/dL (ref 6–20)
CO2: 29 mmol/L (ref 22–32)
Calcium: 8.7 mg/dL — ABNORMAL LOW (ref 8.9–10.3)
Chloride: 104 mmol/L (ref 101–111)
Creatinine, Ser: 0.95 mg/dL (ref 0.44–1.00)
GLUCOSE: 100 mg/dL — AB (ref 65–99)
POTASSIUM: 3.6 mmol/L (ref 3.5–5.1)
Sodium: 140 mmol/L (ref 135–145)

## 2017-10-05 LAB — GLUCOSE, CAPILLARY
GLUCOSE-CAPILLARY: 82 mg/dL (ref 65–99)
GLUCOSE-CAPILLARY: 84 mg/dL (ref 65–99)
Glucose-Capillary: 98 mg/dL (ref 65–99)
Glucose-Capillary: 121 mg/dL — ABNORMAL HIGH (ref 65–99)

## 2017-10-05 MED ORDER — AMPICILLIN-SULBACTAM SODIUM 3 (2-1) G IJ SOLR
3.0000 g | Freq: Four times a day (QID) | INTRAMUSCULAR | Status: DC
  Administered 2017-10-05 – 2017-10-07 (×8): 3 g via INTRAVENOUS
  Filled 2017-10-05 (×11): qty 3

## 2017-10-05 MED ORDER — OCUVITE-LUTEIN PO CAPS
1.0000 | ORAL_CAPSULE | Freq: Every day | ORAL | Status: DC
  Administered 2017-10-05 – 2017-10-06 (×2): 1 via ORAL
  Filled 2017-10-05 (×3): qty 1

## 2017-10-05 MED ORDER — PREMIER PROTEIN SHAKE
11.0000 [oz_av] | Freq: Two times a day (BID) | ORAL | Status: DC
  Administered 2017-10-05 – 2017-10-06 (×3): 11 [oz_av] via ORAL

## 2017-10-05 MED ORDER — ADULT MULTIVITAMIN W/MINERALS CH
1.0000 | ORAL_TABLET | Freq: Every day | ORAL | Status: DC
  Administered 2017-10-05 – 2017-10-06 (×2): 1 via ORAL
  Filled 2017-10-05 (×2): qty 1

## 2017-10-05 MED ORDER — SODIUM CHLORIDE 0.9% FLUSH: 10.0000 mL | INTRAVENOUS | Status: DC | PRN

## 2017-10-05 MED ORDER — SODIUM CHLORIDE 0.9% FLUSH
10.0000 mL | Freq: Two times a day (BID) | INTRAVENOUS | Status: DC
  Administered 2017-10-05 – 2017-10-06 (×3): 10 mL

## 2017-10-05 MED ORDER — VITAMIN C 500 MG PO TABS
250.0000 mg | ORAL_TABLET | Freq: Two times a day (BID) | ORAL | Status: DC
  Administered 2017-10-05 – 2017-10-06 (×4): 250 mg via ORAL
  Filled 2017-10-05 (×5): qty 0.5

## 2017-10-05 MED ORDER — SODIUM CHLORIDE 0.9 % IV SOLN
150.0000 mg | Freq: Once | INTRAVENOUS | Status: AC
  Administered 2017-10-05: 150 mg via INTRAVENOUS
  Filled 2017-10-05: qty 7.5

## 2017-10-05 NOTE — Progress Notes (Signed)
3 Days Post-Op   Subjective/Chief Complaint: Patient seen. States she is currently feeling nauseous and very weak. Physical therapy has tried to get her up to walk which she states was excruciatingly painful. Unable to really transfer or get out of bed.   Objective: Vital signs in last 24 hours: Temp:  [97.5 F (36.4 C)-97.9 F (36.6 C)] 97.7 F (36.5 C) (01/07 1233) Pulse Rate:  [84-97] 97 (01/07 1233) Resp:  [16-20] 16 (01/07 0600) BP: (105-137)/(43-49) 137/49 (01/07 1233) SpO2:  [98 %-100 %] 98 % (01/07 1233) Last BM Date: 10/04/17  Intake/Output from previous day: 01/06 0701 - 01/07 0700 In: 235 [IV Piggyback:235] Out: 500 [Urine:500] Intake/Output this shift: No intake/output data recorded.  Moderate drainage is noted on the bandage on the right foot from the heel surgical site. Cellulitis is still well under control. No frank purulence is expressed or noted. Mepitel and staples intact.  Lab Results:  Recent Labs    10/03/17 0537 10/05/17 0504  WBC 8.8 8.3  HGB 7.7* 7.6*  HCT 24.5* 24.3*  PLT 219 274   BMET Recent Labs    10/03/17 0537 10/05/17 0504  NA 139 140  K 3.8 3.6  CL 103 104  CO2 27 29  GLUCOSE 91 100*  BUN 13 13  CREATININE 0.89 0.95  CALCIUM 8.8* 8.7*   PT/INR No results for input(s): LABPROT, INR in the last 72 hours. ABG No results for input(s): PHART, HCO3 in the last 72 hours.  Invalid input(s): PCO2, PO2  Studies/Results: No results found.  Anti-infectives: Anti-infectives (From admission, onward)   Start     Dose/Rate Route Frequency Ordered Stop   10/05/17 1200  Ampicillin-Sulbactam (UNASYN) 3 g in sodium chloride 0.9 % 100 mL IVPB     3 g 200 mL/hr over 30 Minutes Intravenous Every 6 hours 10/05/17 1119     10/03/17 1200  vancomycin (VANCOCIN) IVPB 1000 mg/200 mL premix  Status:  Discontinued     1,000 mg 200 mL/hr over 60 Minutes Intravenous Every 12 hours 10/03/17 1014 10/05/17 1057   10/01/17 1400  vancomycin (VANCOCIN)  IVPB 1000 mg/200 mL premix  Status:  Discontinued     1,000 mg 200 mL/hr over 60 Minutes Intravenous Every 8 hours 10/01/17 1047 10/02/17 2328   10/01/17 1210  cefUROXime (ZINACEF) 1.5 g in dextrose 5 % 50 mL IVPB  Status:  Discontinued     1.5 g 100 mL/hr over 30 Minutes Intravenous 30 min pre-op 10/01/17 1210 10/01/17 1556   10/01/17 0400  piperacillin-tazobactam (ZOSYN) IVPB 3.375 g  Status:  Discontinued     3.375 g 12.5 mL/hr over 240 Minutes Intravenous Every 8 hours 09/30/17 1933 10/05/17 1057   09/30/17 2200  vancomycin (VANCOCIN) 1,500 mg in sodium chloride 0.9 % 500 mL IVPB  Status:  Discontinued     1,500 mg 250 mL/hr over 120 Minutes Intravenous Every 8 hours 09/30/17 1939 10/01/17 1044   09/30/17 1715  vancomycin (VANCOCIN) IVPB 1000 mg/200 mL premix     1,000 mg 200 mL/hr over 60 Minutes Intravenous  Once 09/30/17 1714 09/30/17 1905   09/30/17 1715  piperacillin-tazobactam (ZOSYN) IVPB 3.375 g     3.375 g 100 mL/hr over 30 Minutes Intravenous  Once 09/30/17 1714 09/30/17 1825      Assessment/Plan: s/p Procedure(s): IRRIGATION AND DEBRIDEMENT FOOT (Right) Assessment: Stable status post debridement soft tissue and bone right heel.   Plan: Sterile dry dressing reapplied to the right heel. From a foot standpoint I  think the patient is stable for discharge with home health care for dressing changes 3 times a week but most likely is going to be very hard for the patient to take care of herself at home upon discharge I will need to see her in about 1 week and will perform the third dressing change every week for home health care at her visits. She will need appropriate antibiotics as well as pain medication. Plan for dressing change on Wednesday of still in the hospital but otherwise will follow up in 1 week  LOS: 5 days    Ricci Barker 10/05/2017

## 2017-10-05 NOTE — Progress Notes (Addendum)
Infectious Disease Long Term IV Antibiotic Orders Charlene Jackson Aug 18, 1971  Diagnosis:  Heel osteomyelitis  Culture results Aerobic/Anaerobic Culture (surgical/deep wound) [[id number]]  Collected: 09/30/17 2318  Order Status: Completed Specimen: Foot Updated: 10/04/17 1506   Specimen Description --   FOOT  Performed at [facility], [address], [place], [place] [address]    Special Requests --   NONE  Performed at [facility], [address] [place], [place] [address]    Gram Stain --   MODERATE WBC PRESENT, PREDOMINANTLY PMN  ABUNDANT GRAM POSITIVE COCCI  MODERATE GRAM NEGATIVE RODS    Culture --   MODERATE PROTEUS MIRABILIS  MODERATE ENTEROCOCCUS FAECALIS  SUSCEPTIBILITIES TO FOLLOW  HOLDING FOR POSSIBLE ANAEROBE  Performed at [facility], [place] [address] [place], [place] [address]    Report Status PENDING   Organism ID, Bacteria PROTEUS MIRABILIS  Susceptibility   Proteus mirabilis ([id number])   Antibiotic Interpretation Microscan Method Status  AMPICILLIN Sensitive <=2 SENSITIVE MIC Final  CEFAZOLIN Sensitive <=4 SENSITIVE MIC Final  CEFEPIME Sensitive <=1 SENSITIVE MIC Final  CEFTAZIDIME Sensitive <=1 SENSITIVE MIC Final  CEFTRIAXONE Sensitive <=1 SENSITIVE MIC Final  CIPROFLOXACIN Sensitive <=0.25 SENSITIVE MIC Final  GENTAMICIN Sensitive <=1 SENSITIVE MIC Final  IMIPENEM Sensitive <=0.25 SENSITIVE MIC Final  TRIMETH/SULFA Sensitive <=20 SENSITIVE MIC Final  AMPICILLIN/SULBACTAM Sensitive <=2 SENSITIVE MIC Final  PIP/TAZO Sensitive <=4 SENSITIVE MIC Final  Susceptibility Comments   MODERATE PROTEUS MIRABILIS         LABS Lab Results  Component Value Date   CREATININE 0.95 10/05/2017   Lab Results  Component Value Date   WBC 8.3 10/05/2017   HGB 7.6 (L) 10/05/2017   HCT 24.3 (L) 10/05/2017   MCV 55.7 (L) 10/05/2017   PLT 274 10/05/2017   Lab Results  Component Value Date   ESRSEDRATE 88 (H) 10/02/2017   Lab Results    Component Value Date   CRP 17.0 (H) 10/02/2017    Allergies: No Known Allergies  Discharge antibiotics Unasyn 3 gm IV q 6 hours  PICC Care per protocol Labs weekly while on IV antibiotics -FAX weekly labs to [phone] CBC w diff   Comprehensive met panel  CRP   Planned duration of antibiotics 4 weeks   Stop date  10/30/16   Follow up clinic date 3 weeks   Leonel Ramsay, MD

## 2017-10-05 NOTE — Care Management (Signed)
Patient for PICC placement, and long term IV antibiotics.  Charlene Jackson with [facility] notified.

## 2017-10-05 NOTE — Progress Notes (Signed)
[facility] Date of Admission:  09/30/2017     ID: Charlene Jackson is a 47 y.o. female with  Heel osteomyelitis  Active Problems:   Sepsis (HCC)   Subjective: NO fevers, but still with foot pain . Developed dental abscess and seen by ENT.   ROS  Eleven systems are reviewed and negative except per hpi  Medications:  Antibiotics Given (last 72 hours)    Date/Time Action Medication Dose Rate   10/02/17 2236 New Bag/Given   piperacillin-tazobactam (ZOSYN) IVPB 3.375 g 3.375 g 12.5 mL/hr   10/03/17 0553 New Bag/Given   piperacillin-tazobactam (ZOSYN) IVPB 3.375 g 3.375 g 12.5 mL/hr   10/03/17 1206 New Bag/Given   vancomycin (VANCOCIN) IVPB 1000 mg/200 mL premix 1,000 mg 200 mL/hr   10/03/17 1454 New Bag/Given   piperacillin-tazobactam (ZOSYN) IVPB 3.375 g 3.375 g 12.5 mL/hr   10/03/17 2320 New Bag/Given   piperacillin-tazobactam (ZOSYN) IVPB 3.375 g 3.375 g 12.5 mL/hr   10/04/17 0104 New Bag/Given   vancomycin (VANCOCIN) IVPB 1000 mg/200 mL premix 1,000 mg 200 mL/hr   10/04/17 0604 New Bag/Given   piperacillin-tazobactam (ZOSYN) IVPB 3.375 g 3.375 g 12.5 mL/hr   10/04/17 1231 New Bag/Given   vancomycin (VANCOCIN) IVPB 1000 mg/200 mL premix 1,000 mg 200 mL/hr   10/04/17 1342 New Bag/Given   piperacillin-tazobactam (ZOSYN) IVPB 3.375 g 3.375 g 12.5 mL/hr   10/04/17 2206 New Bag/Given   piperacillin-tazobactam (ZOSYN) IVPB 3.375 g 3.375 g 12.5 mL/hr   10/04/17 2338 New Bag/Given   vancomycin (VANCOCIN) IVPB 1000 mg/200 mL premix 1,000 mg 200 mL/hr   10/05/17 0522 New Bag/Given   piperacillin-tazobactam (ZOSYN) IVPB 3.375 g 3.375 g 12.5 mL/hr   10/05/17 1220 New Bag/Given   Ampicillin-Sulbactam (UNASYN) 3 g in sodium chloride 0.9 % 100 mL IVPB 3 g 200 mL/hr     . chlorhexidine  15 mL Mouth/Throat QID  . enoxaparin (LOVENOX) injection  40 mg Subcutaneous Q12H  . ferrous sulfate  325 mg Oral BID WC  . insulin aspart  0-9 Units Subcutaneous TID  WC  . multivitamin with minerals  1 tablet Oral Daily  . multivitamin-lutein  1 capsule Oral Daily  . protein supplement shake  11 oz Oral BID BM  . vitamin C  250 mg Oral BID    Objective: Vital signs in last 24 hours: Temp:  [97.5 F (36.4 C)-97.9 F (36.6 C)] 97.7 F (36.5 C) (01/07 1233) Pulse Rate:  [84-97] 97 (01/07 1233) Resp:  [16-20] 16 (01/07 0600) BP: (105-137)/(43-49) 137/49 (01/07 1233) SpO2:  [98 %-100 %] 98 % (01/07 1233) Constitutional:  oriented to person, place, and time. appears well-developed and well-nourished. No distress. obese HENT: [place]/AT, PERRLA, no scleral icterus Mouth/Throat: Oropharynx is clear and moist. No oropharyngeal exudate.  Cardiovascular: Normal rate, regular rhythm and normal heart sounds. Exam reveals no gallop and no friction rub.  No murmur heard.  Pulmonary/Chest: Effort normal and breath sounds normal. No respiratory distress.  has no wheezes.  Neck = supple, no nuchal rigidity Abdominal: Soft. Bowel sounds are normal.  exhibits no distension. There is no tenderness.  Lymphadenopathy: no cervical adenopathy. No axillary adenopathy Ext il chronic 2+ edema Neurological: alert and oriented to person, place, and time.  Skin: chronic erythema to about mid calf with significant papilloma raised lesions. R ankle and heel covered ost op.  Psychiatric: a normal mood and affect.  behavior is normal.     Lab Results Recent Labs  10/03/17 0537 10/05/17 0504  WBC 8.8 8.3  HGB 7.7* 7.6*  HCT 24.5* 24.3*  NA 139 140  K 3.8 3.6  CL 103 104  CO2 27 29  BUN 13 13  CREATININE 0.89 0.95    Microbiology: Results for orders placed or performed during the hospital encounter of 09/30/17  Blood culture (routine x 2)     Status: Abnormal   Collection Time: 09/30/17  5:32 PM  Result Value Ref Range Status   Specimen Description   Final    BLOOD BLOOD LEFT HAND Performed at [facility], [address] [place], [place] [address]     Special Requests   Final    BOTTLES DRAWN AEROBIC AND ANAEROBIC Blood Culture results may not be optimal due to an excessive volume of blood received in culture bottles Performed at [facility], [address] [place], [place] [address]    Culture  Setup Time   Final    GRAM POSITIVE COCCI IN BOTH AEROBIC AND ANAEROBIC BOTTLES CRITICAL RESULT CALLED TO, READ BACK BY AND VERIFIED WITH: NATE COOKSON @1359  10/01/17 [facility]    Culture (A)  Final    STAPHYLOCOCCUS EPIDERMIDIS THE SIGNIFICANCE OF ISOLATING THIS ORGANISM FROM A SINGLE SET OF BLOOD CULTURES WHEN MULTIPLE SETS ARE DRAWN IS UNCERTAIN. PLEASE NOTIFY THE MICROBIOLOGY DEPARTMENT WITHIN ONE WEEK IF SPECIATION AND SENSITIVITIES ARE REQUIRED. STAPHYLOCOCCUS CAPITIS    Report Status 10/04/2017 FINAL  Final   Organism ID, Bacteria STAPHYLOCOCCUS CAPITIS  Final      Susceptibility   Staphylococcus capitis - MIC*    CIPROFLOXACIN <=0.5 SENSITIVE Sensitive     ERYTHROMYCIN <=0.25 SENSITIVE Sensitive     GENTAMICIN <=0.5 SENSITIVE Sensitive     OXACILLIN <=0.25 SENSITIVE Sensitive     TETRACYCLINE <=1 SENSITIVE Sensitive     VANCOMYCIN <=0.5 SENSITIVE Sensitive     TRIMETH/SULFA <=10 SENSITIVE Sensitive     CLINDAMYCIN <=0.25 SENSITIVE Sensitive     RIFAMPIN <=0.5 SENSITIVE Sensitive     Inducible Clindamycin NEGATIVE Sensitive     * STAPHYLOCOCCUS CAPITIS  Blood Culture ID Panel (Reflexed)     Status: Abnormal   Collection Time: 09/30/17  5:32 PM  Result Value Ref Range Status   Enterococcus species NOT DETECTED NOT DETECTED Final   Listeria monocytogenes NOT DETECTED NOT DETECTED Final   Staphylococcus species DETECTED (A) NOT DETECTED Final    Comment: Methicillin (oxacillin) susceptible coagulase negative staphylococcus. Possible blood culture contaminant (unless isolated from more than one blood culture draw or clinical case suggests pathogenicity). No antibiotic treatment is indicated for blood  culture  contaminants. CRITICAL RESULT CALLED TO, READ BACK BY AND VERIFIED WITH: NATE COOKSON @1359  10/01/17 [facility]    Staphylococcus aureus NOT DETECTED NOT DETECTED Final   Methicillin resistance NOT DETECTED NOT DETECTED Final   Streptococcus species NOT DETECTED NOT DETECTED Final   Streptococcus agalactiae NOT DETECTED NOT DETECTED Final   Streptococcus pneumoniae NOT DETECTED NOT DETECTED Final   Streptococcus pyogenes NOT DETECTED NOT DETECTED Final   Acinetobacter baumannii NOT DETECTED NOT DETECTED Final   Enterobacteriaceae species NOT DETECTED NOT DETECTED Final   Enterobacter cloacae complex NOT DETECTED NOT DETECTED Final   Escherichia coli NOT DETECTED NOT DETECTED Final   Klebsiella oxytoca NOT DETECTED NOT DETECTED Final   Klebsiella pneumoniae NOT DETECTED NOT DETECTED Final   Proteus species NOT DETECTED NOT DETECTED Final   Serratia marcescens NOT DETECTED NOT DETECTED Final   Haemophilus influenzae NOT DETECTED NOT DETECTED  Final   Neisseria meningitidis NOT DETECTED NOT DETECTED Final   Pseudomonas aeruginosa NOT DETECTED NOT DETECTED Final   Candida albicans NOT DETECTED NOT DETECTED Final   Candida glabrata NOT DETECTED NOT DETECTED Final   Candida krusei NOT DETECTED NOT DETECTED Final   Candida parapsilosis NOT DETECTED NOT DETECTED Final   Candida tropicalis NOT DETECTED NOT DETECTED Final    Comment: Performed at [facility], [address] [place], [place] [address]  Blood culture (routine x 2)     Status: Abnormal   Collection Time: 09/30/17  5:33 PM  Result Value Ref Range Status   Specimen Description BLOOD RIGHT ANTECUBITAL  Final   Special Requests   Final    BOTTLES DRAWN AEROBIC AND ANAEROBIC Blood Culture adequate volume Performed at [facility], [address], [place], [place] [address]    Culture  Setup Time   Final    GRAM POSITIVE COCCI AEROBIC BOTTLE ONLY CRITICAL VALUE NOTED.  VALUE IS CONSISTENT WITH PREVIOUSLY REPORTED AND  CALLED VALUE.    Culture (A)  Final    STAPHYLOCOCCUS CAPITIS SUSCEPTIBILITIES PERFORMED ON PREVIOUS CULTURE WITHIN THE LAST 5 DAYS. Performed at [facility], [address], [place], [place] [address]    Report Status 10/04/2017 FINAL  Final  Aerobic/Anaerobic Culture (surgical/deep wound)     Status: None (Preliminary result)   Collection Time: 09/30/17 11:18 PM  Result Value Ref Range Status   Specimen Description   Final    FOOT Performed at [facility], [address] [place], [place] [address]    Special Requests   Final    NONE Performed at [facility], [address] [place], [place] [address]    Gram Stain   Final    MODERATE WBC PRESENT, PREDOMINANTLY PMN ABUNDANT GRAM POSITIVE COCCI MODERATE GRAM NEGATIVE RODS    Culture   Final    MODERATE PROTEUS MIRABILIS MODERATE ENTEROCOCCUS FAECALIS SUSCEPTIBILITIES TO FOLLOW HOLDING FOR POSSIBLE ANAEROBE Performed at [facility], [address], [place], [place] [address]    Report Status PENDING  Incomplete   Organism ID, Bacteria PROTEUS MIRABILIS  Final      Susceptibility   Proteus mirabilis - MIC*    AMPICILLIN <=2 SENSITIVE Sensitive     CEFAZOLIN <=4 SENSITIVE Sensitive     CEFEPIME <=1 SENSITIVE Sensitive     CEFTAZIDIME <=1 SENSITIVE Sensitive     CEFTRIAXONE <=1 SENSITIVE Sensitive     CIPROFLOXACIN <=0.25 SENSITIVE Sensitive     GENTAMICIN <=1 SENSITIVE Sensitive     IMIPENEM <=0.25 SENSITIVE Sensitive     TRIMETH/SULFA <=20 SENSITIVE Sensitive     AMPICILLIN/SULBACTAM <=2 SENSITIVE Sensitive     PIP/TAZO <=4 SENSITIVE Sensitive     * MODERATE PROTEUS MIRABILIS  Urine culture     Status: None   Collection Time: 10/01/17  6:30 AM  Result Value Ref Range Status   Specimen Description   Final    URINE, RANDOM Performed at [facility], [address] [place], [place] [address]    Special Requests   Final    NONE Performed at [facility], [address], [place], [place] [address]    Culture   Final    NO GROWTH Performed at [facility], [address], [place], [place] [address]    Report Status 10/02/2017 FINAL  Final  Aerobic/Anaerobic Culture (surgical/deep wound)     Status: None (Preliminary result)  Collection Time: 10/02/17  8:43 AM  Result Value Ref Range Status   Specimen Description   Final    BONE HEEL OF FOOT RIGHT Performed at [facility], [address], [place], [place] [address]    Special Requests   Final    NONE Performed at [facility], [address] [place], [place] [address]    Gram Stain   Final    NO WBC SEEN NO ORGANISMS SEEN Performed at [facility], [address] [place], [place] [address]    Culture   Final    RARE STAPHYLOCOCCUS SPECIES (COAGULASE NEGATIVE) RARE PROTEUS MIRABILIS SUSCEPTIBILITIES TO FOLLOW CRITICAL RESULT CALLED TO, READ BACK BY AND VERIFIED WITH: J RODRIGUEZ,RN AT 1452 10/03/17 BY L BENFIELD CONCERNING GROWTH ON CULTURE NO ANAEROBES ISOLATED; CULTURE IN PROGRESS FOR 5 DAYS    Report Status PENDING  Incomplete  Culture, blood (single) w Reflex to ID Panel     Status: None (Preliminary result)   Collection Time: 10/02/17  8:02 PM  Result Value Ref Range Status   Specimen Description BLOOD LEFT FOREARM  Final   Special Requests   Final    BOTTLES DRAWN AEROBIC AND ANAEROBIC Blood Culture adequate volume   Culture   Final    NO GROWTH 3 DAYS Performed at [facility], [address], [place], [place] [address]    Report Status PENDING  Incomplete    Studies/Results: No results found.  Lab Results  Component Value Date   ESRSEDRATE 88 (H) 10/02/2017   Lab Results  Component Value Date   CRP 17.0 (H) 10/02/2017    Assessment/Plan: Charlene Jackson is a 47 y.o. female with R heel osteomyelitis and abscess in setting of chronic lymphedema/venous stasis with severe bil LE venous stasis changes.  BCX + Coag neg  staph.  Wound cx with proteus, enterococcus and coag neg staph . S/p Angiogram and debridement 10/02/16. She has a complicated social situation which I have discussed with case Production designer, theatre/television/film. .   Recommendations Given osteomyelitis will need PICC Line and IV abx.  I discussed with patient and she agrees.  Will need wound care of the heel as well. Since no insurance will be difficult to treat and will need charity abx. For the venous stasis changes she will need long term control of LE edema and skin care.  Previously followed with dermatology. For now would elevate legs. At dc can place Unnaboot on the L leg.    Thank you very much for the consult. Will follow with you.  Mick Sell   10/05/2017, 2:22 PM

## 2017-10-05 NOTE — Progress Notes (Signed)
[facility] [place] [facility]   PATIENT NAME: Charlene Jackson    MR#:  [id number]  DATE OF BIRTH:  [birth year]  SUBJECTIVE:  CHIEF COMPLAINT:   Chief Complaint  Patient presents with  . Leg Pain   Have severe lymphedema and bilateral leg swelling, came with worsening pain and redness with some discharge from right heel. Found to have osteomyelitis on MRI. Vascular angiogram on the right lower extremity was done and did not show any abnormality. Status post podiatry procedure again with some debridement of bone. Had severe pain yesterday while trying to work with physical therapy. And patient is claiming that she could not get out of bed without help.  REVIEW OF SYSTEMS:  CONSTITUTIONAL: No fever, positive for fatigue or weakness.  EYES: No blurred or double vision.  EARS, NOSE, AND THROAT: No tinnitus or ear pain.  RESPIRATORY: No cough, shortness of breath, wheezing or hemoptysis.  CARDIOVASCULAR: No chest pain, orthopnea, edema.  GASTROINTESTINAL: No nausea, vomiting, diarrhea or abdominal pain.  GENITOURINARY: No dysuria, hematuria.  ENDOCRINE: No polyuria, nocturia,  HEMATOLOGY: No anemia, easy bruising or bleeding SKIN: Chronic skin changes due to lymphedema and venous stasis on both legs. MUSCULOSKELETAL: No joint pain or arthritis.   NEUROLOGIC: No tingling, numbness, weakness.  PSYCHIATRY: No anxiety or depression.   ROS  DRUG ALLERGIES:  No Known Allergies  VITALS:  Blood pressure (!) 137/49, pulse 97, temperature 97.7 F (36.5 C), temperature source Oral, resp. rate 16, height 5\' 7"  (1.702 m), weight 127 kg (280 lb), last menstrual period 10/01/2017, SpO2 98 %.  PHYSICAL EXAMINATION:   GENERAL:  47 y.o.-year-old patient lying in the bed with no acute distress.  Obese EYES: Pupils equal, round, reactive to light and accommodation. No scleral icterus. Extraocular muscles intact.  HEENT: Head atraumatic, normocephalic. Oropharynx and nasopharynx  clear. Right side face swelling, non tender, non fluctuation. NECK:  Supple, no jugular venous distention. No thyroid enlargement, no tenderness.  LUNGS: Normal breath sounds bilaterally, no wheezing, rales,rhonchi or crepitation. No use of accessory muscles of respiration.  CARDIOVASCULAR: S1, S2 normal. No murmurs, rubs, or gallops.  ABDOMEN: Soft, nontender, nondistended. Bowel sounds present. No organomegaly or mass.  EXTREMITIES: Skin is warm there is swelling redness and pus with blood drainage from the right foot.  She has impressive excessive skin/plaque buildup to bilateral feet and up to the mid tibial shin, chronic changes of lymphedema and venous stasis on both legs. S/p surgical dressing on right foot. NEUROLOGIC: Grossly intact unable to assess secondary to patient's severe body posture. pSYCHIATRIC: The patient is alert and oriented x 3.  SKIN: No obvious rash, lesion, or ulcer.    Physical Exam LABORATORY PANEL:   CBC Recent Labs  Lab 10/05/17 0504  WBC 8.3  HGB 7.6*  HCT 24.3*  PLT 274   ------------------------------------------------------------------------------------------------------------------  Chemistries  Recent Labs  Lab 09/30/17 1658  10/05/17 0504  NA 135   < > 140  K 4.0   < > 3.6  CL 98*   < > 104  CO2 28   < > 29  GLUCOSE 144*   < > 100*  BUN 22*   < > 13  CREATININE 0.81   < > 0.95  CALCIUM 9.6   < > 8.7*  AST 45*  --   --   ALT 35  --   --   ALKPHOS 197*  --   --   BILITOT 0.3  --   --    < > =  values in this interval not displayed.   ------------------------------------------------------------------------------------------------------------------  Cardiac Enzymes No results for input(s): TROPONINI in the last 168 hours. ------------------------------------------------------------------------------------------------------------------  RADIOLOGY:  No results found.  ASSESSMENT AND PLAN:   Active Problems:   Sepsis (HCC)  Charlene Jackson  is a 47 y.o. female with past medical history of stasis dermatitis both lower extremity presents to the emergency room department for worsening right foot pain and worsening bilateral lower extremity swelling with fungating skin infection along with significant amount of drainage from the right heel.  Patient says at baseline her legs have been swollen with black/brown scaly skin that he relates on her feet falls off on its own.  She used to follow-up with a dermatologist however has lost to do so in many months.  1.  Sepsis secondary to bilateral lower extremity cellulitis, right heel osteomyelitis  significant wound infection foul-smelling right foot more than the left -Patient had severe amount of drainage from the right heel.  -IV Vanco and Zosyn -Podiaty, and vascular consultation -Wound consult -X-ray both feet no evidence of osteomyelitis noted on plain x-ray film- MRI confirmed OM. - Initial debridement done by podiatry. -Vascular pedal angiogram to check for circulation on the right lower extremity and it was found satisfactory 10/01/17. - Further debridement and some scrapping of bone done by podiatry on 10/02/2017 - ID consult to guide IV antibiotic therapy. - Suggest IV Unasyn for total 4 weeks - stop date 10/30/2017. - Ordered a PICC line today.  2.  Hyperglycemia Check A1c- 5.4 Sliding scale insulin  3.  Patient states she has history of thyroid problem - normal TSH  4.  DVT prophylaxis subcu Lovenox  5. Bacteremia- appears to be contamination. Follow cultures.  6. Called PT consult.  7. Iron deficiency anemia   Check iron studies and give oral iron.   Give IV iron 1 dose.  8. Right facial swelling   ENT consult.- Faced continue Zosyn and follow up with dentist as soon as possible on discharge.  9. Generalized weakness   Physical therapy evaluation suggested SNF placement, but due to her insurance issues and no coverage she is not a candidate to go to  rehabilitation.   Discussed these with patient and options for arrangements at home with home health. In encouraged her to participate with physical therapy and possible discharge in next 1-2 days.  All the records are reviewed and case discussed with Care Management/Social Workerr. Management plans discussed with the patient, family and they are in agreement.  CODE STATUS: Full.  TOTAL TIME TAKING CARE OF THIS PATIENT: 35 minutes.    POSSIBLE D/C IN 1-2 DAYS, DEPENDING ON CLINICAL CONDITION.   Altamese Dilling M.D on 10/05/2017   Between 7am to 6pm - Pager - [phone]  After 6pm go to [email or web address] - password [email or web address]  [facility]  Office  [phone]  CC: Primary care physician; Patient, No Pcp Per  Note: This dictation was prepared with Dragon dictation along with smaller phrase technology. Any transcriptional errors that result from this process are unintentional.

## 2017-10-05 NOTE — Progress Notes (Signed)
Peripherally Inserted Central Catheter/Midline Placement  The IV Nurse has discussed with the patient and/or persons authorized to consent for the patient, the purpose of this procedure and the potential benefits and risks involved with this procedure.  The benefits include less needle sticks, lab draws from the catheter, and the patient may be discharged home with the catheter. Risks include, but not limited to, infection, bleeding, blood clot (thrombus formation), and puncture of an artery; nerve damage and irregular heartbeat and possibility to perform a PICC exchange if needed/ordered by physician.  Alternatives to this procedure were also discussed.  Bard Power PICC patient education guide, fact sheet on infection prevention and patient information card has been provided to patient /or left at bedside.    PICC/Midline Placement Documentation  PICC Single Lumen 10/05/17 PICC Right Brachial 43 cm 0 cm (Active)  Indication for Insertion or Continuance of Line Home intravenous therapies (PICC only) 10/05/2017  5:00 PM  Exposed Catheter (cm) 0 cm 10/05/2017  5:00 PM  Site Assessment Clean;Dry;Intact 10/05/2017  5:00 PM  Line Status Flushed;Blood return noted 10/05/2017  5:00 PM  Dressing Type Transparent 10/05/2017  5:00 PM  Dressing Status Clean;Dry;Intact;Antimicrobial disc in place 10/05/2017  5:00 PM  Dressing Change Due 10/12/17 10/05/2017  5:00 PM       Stacie GlazeJoyce, Kaydan Wong [place] 10/05/2017, 6:01 PM

## 2017-10-05 NOTE — Progress Notes (Signed)
Pharmacy Antibiotic Note  Charlene Jackson is a 47 y.o. female admitted on 09/30/2017 with wound infection.  Pharmacy has been consulted for vancomycin and piperacillin/tazobactam dosing.  Plan: Continue piperacillin/tazobactam 3.375 g IV q8h EI  Patient has received several doses of current regimen of vancomycin 1500 mg IV q8h.  Will reduce dose based on kinetic calculations to vancomycin 1000 mg IV q8h and check VT at steady state. Goal VT 15-20 mcg/mL Patient is at risk of accumulation due to obesity  Kinetics: Using adjusted body weight = 87 kg, CrCl 100 mL/min Ke: 0.087 Half-life: 7.9 hrs Vd: 61 L Cmin (calculated) = 16 mcg/mL  1/4: VT @ 21:30 = 34 mcg/mL .   Level seems to be drawn appropriately ,  Toxic level is probably due to accumulation. Will hold Vanc for now and recheck SrCr.   01/04 @ 2135 Scr 1.02 >> 0.81 Scr rose by 0.21, UOP stable @ 1.2L, will hold off vanc for now and draw a random level w/ another bmp w/ am labs since Scr may still be rising.  1/5: Random vanc level 1/5 at 0537 = 25, Scr back down to 0.89.  Two level kinetics suggests Ke of 0.038 with half life of 18, but initial high trough likely partly due to high 1500 mg IV q8h x2 doses. Will change dose to vancomycin 1000 mg IV q12h about when level expected to be ~20. Will order trough prior to 4th dose. VT 1/6 at 2330. Need to continue to watch renal function.   1/6 2330 vanc level apparently not drawn. Dose given at 2338. Will reschedule level for 1130 1/7.  Height: 5\' 7"  (170.2 cm) Weight: 280 lb (127 kg) IBW/kg (Calculated) : 61.6  Temp (24hrs), Avg:98.4 F (36.9 C), Min:97.5 F (36.4 C), Max:98.9 F (37.2 C)  Recent Labs  Lab 09/30/17 1658 09/30/17 1732 09/30/17 2150 10/02/17 2135 10/03/17 0537  WBC 13.8*  --   --   --  8.8  CREATININE 0.81  --   --  1.02* 0.89  LATICACIDVEN  --  2.1* 0.9  --   --   VANCOTROUGH  --   --   --  34*  --   VANCORANDOM  --   --   --   --  25    Estimated  Creatinine Clearance: 107.1 mL/min (by C-G formula based on SCr of 0.89 mg/dL).    No Known Allergies  Micro:  1/2 Blood: coag neg staph/pending 1/3 Urine: NG 1/2 WCx pending 1/4 WCx pending 1/4 BCx NGTD x1   Thank you for allowing pharmacy to be a part of this patient's care.  Erich MontaneMcBane,Aydan Levitz S, PharmD Clinical Pharmacist 10/05/2017 12:31 AM

## 2017-10-05 NOTE — Clinical Social Work Note (Signed)
CSW received call from patient's sister in law: Charlene Jackson: [phone] expressing concern regarding a plan for patient's discharge. CSW explained that RN CM is currently working on a plan to return home and that due to having no insurance, placing patient in a rehab facility is not feasible. CSW also explained that a DSS CPS report was made out of concern for patient's 47 year old and Mrs. Jackson stated she understood why. York SpanielMonica Tjay Jackson MSW,LCSW [phone]

## 2017-10-05 NOTE — Consult Note (Signed)
PHARMACY CONSULT NOTE FOR:  OUTPATIENT  PARENTERAL ANTIBIOTIC THERAPY (OPAT)  Indication: Osteomyletitis Regimen: unasyn 12g continuous infusion End date: 10/28/17  IV antibiotic discharge orders are pended. To discharging provider:  please sign these orders via discharge navigator,  Select New Orders & click on the button choice - Manage This Unsigned Work.     Thank you for allowing pharmacy to be a part of this patient's care.  Olene FlossMelissa D Aedyn Mckeon, Pharm.D, BCPS Clinical Pharmacist

## 2017-10-05 NOTE — Progress Notes (Signed)
Physical Therapy Treatment Patient Details Name: Charlene Jackson MRN: [id number] DOB: 1970-11-17 Today's Date: 10/05/2017    History of Present Illness 47 yo Female came to ED with increased swelling and with fungating skin infection along with significant amount of drainage from the right heel; patient is s/p debridement of RLE heel for osteomyelitis; PMH significant for DM;     PT Comments    Pt agreeable to PT; reports 7/10 pain in R foot with activity/weight bearing. Pt requires a great deal of instruction and cueing for focus on tasks at hand, limiting defeating talk and encouragement during tasks. Pt able to demonstrate improved bed mobility to Mod I, STS transfers to Min guard with 2+ for safety and short ambulation with Min guard with 2+ for safety. Pt received up in chair. Significant increased time to perform all tasks. Pt encouraged to use the power of positive thinking and focus on accomplishments versus defeating thoughts and yesterday's difficulties to allow for progress. Continue PT to progress strength, endurance to improve all functional mobility and allow for decreased assistance.    Follow Up Recommendations  SNF     Equipment Recommendations       Recommendations for Other Services       Precautions / Restrictions Precautions Precautions: Fall Restrictions Weight Bearing Restrictions: No RLE Weight Bearing: Touchdown weight bearing    Mobility  Bed Mobility Overal bed mobility: Modified Independent Bed Mobility: Supine to Sit     Supine to sit: Modified independent (Device/Increase time)     General bed mobility comments: Requires a lot of encouragement to realize pt can move without assist. Significant increased time to accomplish, but not excessively effortful once gaining mindset to perform task at hand  Transfers Overall transfer level: Needs assistance Equipment used: Rolling walker (2 wheeled) Transfers: Sit to/from Stand Sit to Stand: Min guard;+2  safety/equipment         General transfer comment: Performed twice, once from bed; [facility]. Again requires instruction to focus only on task, mind over matter, eliminate defeating self talk and hand placement cues  Ambulation/Gait Ambulation/Gait assistance: Min guard;+2 safety/equipment Ambulation Distance (Feet): 3 Feet Assistive device: Rolling walker (2 wheeled) Gait Pattern/deviations: Step-to pattern;Trunk flexed;Wide base of support;Decreased step length - right;Decreased step length - left     General Gait Details: bed to BSC; BSC bkwd steps to recliner   Stairs            Wheelchair Mobility    Modified Rankin (Stroke Patients Only)       Balance Overall balance assessment: Needs assistance Sitting-balance support: Feet supported;Bilateral upper extremity supported Sitting balance-Leahy Scale: Good     Standing balance support: Bilateral upper extremity supported;During functional activity Standing balance-Leahy Scale: Fair                              Cognition Arousal/Alertness: Awake/alert Behavior During Therapy: WFL for tasks assessed/performed Overall Cognitive Status: Within Functional Limits for tasks assessed                                 General Comments: requires re direction to focus only at task at hand and instruction for decreased negative talk and encouraged positive self talk      Exercises      General Comments        Pertinent Vitals/Pain Pain Assessment: 0-10 Pain  Score: 7  Pain Location: right foot Pain Intervention(s): RN gave pain meds during session;Monitored during session    Home Living                      Prior Function            PT Goals (current goals can now be found in the care plan section) Progress towards PT goals: Progressing toward goals    Frequency    Min 2X/week      PT Plan Current plan remains appropriate    Co-evaluation               AM-PAC PT "6 Clicks" Daily Activity  Outcome Measure  Difficulty turning over in bed (including adjusting bedclothes, sheets and blankets)?: A Little Difficulty moving from lying on back to sitting on the side of the bed? : A Little Difficulty sitting down on and standing up from a chair with arms (e.g., wheelchair, bedside commode, etc,.)?: Unable Help needed moving to and from a bed to chair (including a wheelchair)?: A Little Help needed walking in hospital room?: A Little Help needed climbing 3-5 steps with a railing? : Total 6 Click Score: 14    End of Session Equipment Utilized During Treatment: Gait belt Activity Tolerance: Patient tolerated treatment well;Other (comment)(self limiting) Patient left: in chair;with chair alarm set;with nursing/sitter in room;with call bell/phone within reach   PT Visit Diagnosis: Unsteadiness on feet (R26.81);Muscle weakness (generalized) (M62.81)     Time: 4401-0272 PT Time Calculation (min) (ACUTE ONLY): 43 min  Charges:  $Gait Training: 8-22 mins $Therapeutic Activity: 23-37 mins                    G CodesScot Dock, PTA 10/05/2017, 3:02 PM

## 2017-10-05 NOTE — Progress Notes (Addendum)
Initial Nutrition Assessment  DOCUMENTATION CODES:   Morbid obesity  INTERVENTION:   Premier Protein BID, each supplement provides 160 kcal and 30 grams of protein.   Ocuvite daily for wound healing (provides zinc, vitamin A, vitamin C, Vitamin E, copper, and selenium)  Vitamin C 250 mg BID  MVI daily  NUTRITION DIAGNOSIS:   Increased nutrient needs related to wound healing, other (see comment)(morbid obesity ) as evidenced by increased estimated needs from protien.  GOAL:   Patient will meet greater than or equal to 90% of their needs  MONITOR:   PO intake, Supplement acceptance, Labs, Weight trends, I & O's, Skin  REASON FOR ASSESSMENT:   Diagnosis    ASSESSMENT:   47 y.o. female with past medical history of stasis dermatitis both lower extremity presents to the emergency room department for worsening right foot pain and worsening bilateral lower extremity swelling with fungating skin infection along with significant amount of drainage from the right heel.   Pt s/p angiogram 1/3, I & D 1/4 & 1/6   Met with pt in room today. Pt reports poor appetite and oral intake for the past week r/t nausea. Pt is currently eating 25% of her meals in hospital. Pt reports continued nausea but no vomiting. RD discussed with pt the importance of adequate protein and vitamin intake needed for wound healing. RD will order multivitamin and Premier Protein. RD suspects this pt is with scurvy r/t non healing wound and poor intake of fruits and vegetables. Pt also noted to bruise easily and noted to have poor dentition. Pt concerned about possible discharge today as she feels she is not ready. Pt reports constipation today.     Medications reviewed and include: lovenox, ferrous sulfate, insulin, unasyn   Labs reviewed: Hgb 7.6(L), Hct 24.3(L)  Nutrition-Focused physical exam completed. Findings are no fat depletion, no muscle depletion, and severe edema BLE.   Diet Order:  Diet Carb Modified  Fluid consistency: Thin; Room service appropriate? Yes  EDUCATION NEEDS:   Education needs have been addressed  Skin:  Skin Assessment: (wound right foot and heel)  Last BM:  1/6- type 6  Height:   Ht Readings from Last 1 Encounters:  09/30/17 5' 7"  (1.702 m)    Weight:   Wt Readings from Last 1 Encounters:  09/30/17 280 lb (127 kg)    Ideal Body Weight:  61.36 kg  BMI:  Body mass index is 43.85 kg/m.  Estimated Nutritional Needs:   Kcal:  2100-2400kcal/day   Protein:  127-140g/day   Fluid:  >2L/day  Koleen Distance MS, RD, LDN Pager #[phone] After Hours Pager: [phone]

## 2017-10-06 LAB — GLUCOSE, CAPILLARY
GLUCOSE-CAPILLARY: 79 mg/dL (ref 65–99)
Glucose-Capillary: 107 mg/dL — ABNORMAL HIGH (ref 65–99)
Glucose-Capillary: 109 mg/dL — ABNORMAL HIGH (ref 65–99)
Glucose-Capillary: 98 mg/dL (ref 65–99)
Glucose-Capillary: 113 mg/dL — ABNORMAL HIGH (ref 65–99)

## 2017-10-06 LAB — AEROBIC/ANAEROBIC CULTURE W GRAM STAIN (SURGICAL/DEEP WOUND)

## 2017-10-06 LAB — AEROBIC/ANAEROBIC CULTURE (SURGICAL/DEEP WOUND)

## 2017-10-06 LAB — SURGICAL PATHOLOGY

## 2017-10-06 MED ORDER — AMPICILLIN-SULBACTAM IV (FOR PTA / DISCHARGE USE ONLY): 3.0000 g | Freq: Four times a day (QID) | INTRAVENOUS | 0 refills | Status: AC

## 2017-10-06 MED ORDER — OXYCODONE-ACETAMINOPHEN 5-325 MG PO TABS: 1.0000 | ORAL_TABLET | Freq: Four times a day (QID) | ORAL | 0 refills | Status: AC | PRN

## 2017-10-06 MED ORDER — POLYETHYLENE GLYCOL 3350 17 G PO PACK: 17.0000 g | PACK | Freq: Every day | ORAL | 0 refills | Status: AC | PRN

## 2017-10-06 MED ORDER — FERROUS SULFATE 325 (65 FE) MG PO TABS: 325.0000 mg | ORAL_TABLET | Freq: Two times a day (BID) | ORAL | 3 refills | Status: AC

## 2017-10-06 MED ORDER — ASCORBIC ACID 250 MG PO TABS: 250.0000 mg | ORAL_TABLET | Freq: Two times a day (BID) | ORAL | 0 refills | Status: AC

## 2017-10-06 NOTE — Progress Notes (Signed)
Physical Therapy Treatment Patient Details Name: Charlene Jackson FergJearld Piesuson MRN: [id number] DOB: 1971/09/21 Today's Date: 10/06/2017    History of Present Illness 47 yo Female came to ED with increased swelling and with fungating skin infection along with significant amount of drainage from the right heel; patient is s/p debridement of RLE heel for osteomyelitis; PMH significant for DM;     PT Comments    Pt up in chair and agreeable to PT for exercises only. Pt reports greater pain at B knees this day; pt repositioned for improved leg comfort. Pt participates in long sit exercises with assist as needed. Also instructed in mini chair push up. Continue PT to progress strength and endurance to improve all functional mobility.    Follow Up Recommendations        Equipment Recommendations       Recommendations for Other Services       Precautions / Restrictions Precautions Precautions: Fall Restrictions Weight Bearing Restrictions: No    Mobility  Bed Mobility               General bed mobility comments: Not tested; up in chair and wishes to remain in chair  Transfers                 General transfer comment: Refused weightbearing activity  Ambulation/Gait                 Stairs            Wheelchair Mobility    Modified Rankin (Stroke Patients Only)       Balance                                            Cognition Arousal/Alertness: Awake/alert Behavior During Therapy: WFL for tasks assessed/performed Overall Cognitive Status: Within Functional Limits for tasks assessed                                        Exercises General Exercises - Lower Extremity Ankle Circles/Pumps: AROM;Both;20 reps Quad Sets: Strengthening;Both;20 reps Gluteal Sets: Strengthening;Both;20 reps Heel Slides: AAROM;Right;10 reps(2 sets; AROM L ) Hip ABduction/ADduction: AAROM;Both;20 reps Straight Leg Raises: AAROM;Both;10  reps Other Exercises Other Exercises: adductor squeeze, 20x  Other Exercises: chair push ups 5 x 2    General Comments        Pertinent Vitals/Pain Pain Assessment: 0-10 Pain Score: 7  Pain Location: B knees, Jackson foot Pain Intervention(s): Limited activity within patient's tolerance;Monitored during session    Home Living                      Prior Function            PT Goals (current goals can now be found in the care plan section) Progress towards PT goals: Progressing toward goals    Frequency    Min 2X/week      PT Plan Current plan remains appropriate    Co-evaluation              AM-PAC PT "6 Clicks" Daily Activity  Outcome Measure  Difficulty turning over in bed (including adjusting bedclothes, sheets and blankets)?: A Little Difficulty moving from lying on back to sitting on the side of the bed? : A Little Difficulty sitting  down on and standing up from a chair with arms (e.g., wheelchair, bedside commode, etc,.)?: Unable Help needed moving to and from a bed to chair (including a wheelchair)?: A Little Help needed walking in hospital room?: A Little Help needed climbing 3-5 steps with a railing? : Total 6 Click Score: 14    End of Session   Activity Tolerance: Patient tolerated treatment well;Other (comment) Patient left: in chair;with chair alarm set   PT Visit Diagnosis: Unsteadiness on feet (R26.81);Muscle weakness (generalized) (M62.81)     Time: 4098-1191 PT Time Calculation (min) (ACUTE ONLY): 23 min  Charges:  $Therapeutic Exercise: 23-37 mins                    G Codes:  Functional Assessment Tool Used: AM-PAC 6 Clicks Basic Mobility     Scot Dock, PTA 10/06/2017, 3:46 PM

## 2017-10-06 NOTE — Care Management (Signed)
Patient to discharge tomorrow morning after 0600 dose of IV antibiotics.  [facility] to open patient tomorrow and administer 1200 dose.  Patient to discharge on unasyn QID. RNCM spoke with patient and brother to updated.  Brother to provide transportation tomorrow.  Patient to be the primary person learning to administer home IV antibiotics.  Brother states that him and his wife will be available also to learn and assist when possible.  [facility] to provide RN, wound care, PT, RW, and BSC.  RW and BSC to be delivered to room prior to discharge.  RNCM following.

## 2017-10-06 NOTE — Progress Notes (Signed)
[facility] - [place] [facility]   PATIENT NAME: Charlene Jackson    MR#:  [id number]  DATE OF BIRTH:  10/23/1970  SUBJECTIVE:  CHIEF COMPLAINT:   Chief Complaint  Patient presents with  . Leg Pain   Have severe lymphedema and bilateral leg swelling, came with worsening pain and redness with some discharge from right heel. Found to have osteomyelitis on MRI. Vascular angiogram on the right lower extremity was done and did not show any abnormality. Status post podiatry procedure again with some debridement of bone. Sit In chair today. Working on arrangements of her Abx. Her brother can not come to pick her up today.  REVIEW OF SYSTEMS:  CONSTITUTIONAL: No fever, positive for fatigue or weakness.  EYES: No blurred or double vision.  EARS, NOSE, AND THROAT: No tinnitus or ear pain.  RESPIRATORY: No cough, shortness of breath, wheezing or hemoptysis.  CARDIOVASCULAR: No chest pain, orthopnea, edema.  GASTROINTESTINAL: No nausea, vomiting, diarrhea or abdominal pain.  GENITOURINARY: No dysuria, hematuria.  ENDOCRINE: No polyuria, nocturia,  HEMATOLOGY: No anemia, easy bruising or bleeding SKIN: Chronic skin changes due to lymphedema and venous stasis on both legs. MUSCULOSKELETAL: No joint pain or arthritis.   NEUROLOGIC: No tingling, numbness, weakness.  PSYCHIATRY: No anxiety or depression.   ROS  DRUG ALLERGIES:  No Known Allergies  VITALS:  Blood pressure (!) 130/49, pulse 86, temperature (!) 97.5 F (36.4 C), temperature source Oral, resp. rate 20, height 5\' 7"  (1.702 m), weight 127 kg (280 lb), last menstrual period 10/01/2017, SpO2 99 %.  PHYSICAL EXAMINATION:   GENERAL:  47 y.o.-year-old patient lying in the bed with no acute distress.  Obese EYES: Pupils equal, round, reactive to light and accommodation. No scleral icterus. Extraocular muscles intact.  HEENT: Head atraumatic, normocephalic. Oropharynx and nasopharynx clear. Right side face swelling,  non tender, non fluctuation. NECK:  Supple, no jugular venous distention. No thyroid enlargement, no tenderness.  LUNGS: Normal breath sounds bilaterally, no wheezing, rales,rhonchi or crepitation. No use of accessory muscles of respiration.  CARDIOVASCULAR: S1, S2 normal. No murmurs, rubs, or gallops.  ABDOMEN: Soft, nontender, nondistended. Bowel sounds present. No organomegaly or mass.  EXTREMITIES: Skin is warm there is swelling redness and pus with blood drainage from the right foot.  She has impressive excessive skin/plaque buildup to bilateral feet and up to the mid tibial shin, chronic changes of lymphedema and venous stasis on both legs. S/p surgical dressing on right foot. NEUROLOGIC: Grossly intact moves limbs spontaneous, but not much due to massive obesity. pSYCHIATRIC: The patient is alert and oriented x 3.  SKIN: No obvious rash, lesion, or ulcer.    Physical Exam LABORATORY PANEL:   CBC Recent Labs  Lab 10/05/17 0504  WBC 8.3  HGB 7.6*  HCT 24.3*  PLT 274   ------------------------------------------------------------------------------------------------------------------  Chemistries  Recent Labs  Lab 09/30/17 1658  10/05/17 0504  NA 135   < > 140  K 4.0   < > 3.6  CL 98*   < > 104  CO2 28   < > 29  GLUCOSE 144*   < > 100*  BUN 22*   < > 13  CREATININE 0.81   < > 0.95  CALCIUM 9.6   < > 8.7*  AST 45*  --   --   ALT 35  --   --   ALKPHOS 197*  --   --   BILITOT 0.3  --   --    < > =  values in this interval not displayed.   ------------------------------------------------------------------------------------------------------------------  Cardiac Enzymes No results for input(s): TROPONINI in the last 168 hours. ------------------------------------------------------------------------------------------------------------------  RADIOLOGY:  No results found.  ASSESSMENT AND PLAN:   Active Problems:   Sepsis (HCC)  Charlene ChacoLisa Maisano  is a 47 y.o. female  with past medical history of stasis dermatitis both lower extremity presents to the emergency room department for worsening right foot pain and worsening bilateral lower extremity swelling with fungating skin infection along with significant amount of drainage from the right heel.  Patient says at baseline her legs have been swollen with black/brown scaly skin that he relates on her feet falls off on its own.  She used to follow-up with a dermatologist however has lost to do so in many months.  1.  Sepsis secondary to bilateral lower extremity cellulitis, right heel osteomyelitis  significant wound infection foul-smelling right foot more than the left -Patient had severe amount of drainage from the right heel.  -IV Vanco and Zosyn -Podiaty, and vascular consultation -Wound consult -X-ray both feet no evidence of osteomyelitis noted on plain x-ray film- MRI confirmed OM. - Initial debridement done by podiatry. -Vascular pedal angiogram to check for circulation on the right lower extremity and it was found satisfactory 10/01/17. - Further debridement and some scrapping of bone done by podiatry on 10/02/2017 - ID consult to guide IV antibiotic therapy. - Suggest IV Unasyn for total 4 weeks - stop date 10/30/2017. - Ordered a PICC line , and arrangements for out pt abx, home health.  2.  Hyperglycemia Check A1c- 5.4 Sliding scale insulin  3.  Patient states she has history of thyroid problem - normal TSH  4.  DVT prophylaxis subcu Lovenox  5. Bacteremia- appears to be contamination. Follow cultures.  6. Called PT consult.   Suggest SNF, but pt have no insurance, and CM working on getting charity care for home health.  7. Iron deficiency anemia   Check iron studies and give oral iron.   Give IV iron 1 dose.  8. Right facial swelling   ENT consult.- Faced continue Zosyn and follow up with dentist as soon as possible on discharge.  9. Generalized weakness   Physical therapy  evaluation suggested SNF placement, but due to her insurance issues and no coverage she is not a candidate to go to rehabilitation.   Discussed these with patient and options for arrangements at home with home health. In encouraged her to participate with physical therapy and possible discharge in next 1-2 days.  All the records are reviewed and case discussed with Care Management/Social Workerr. Management plans discussed with the patient, family and they are in agreement.  CODE STATUS: Full.  TOTAL TIME TAKING CARE OF THIS PATIENT: 35 minutes.    POSSIBLE D/C IN 1-2 DAYS, DEPENDING ON CLINICAL CONDITION.   Altamese DillingVaibhavkumar Neco Kling M.D on 10/06/2017   Between 7am to 6pm - Pager - [phone]  After 6pm go to [email or web address] - password [email or web address]  [facility]  Office  [phone]  CC: Primary care physician; Patient, No Pcp Per  Note: This dictation was prepared with Dragon dictation along with smaller phrase technology. Any transcriptional errors that result from this process are unintentional.

## 2017-10-07 LAB — AEROBIC/ANAEROBIC CULTURE W GRAM STAIN (SURGICAL/DEEP WOUND)

## 2017-10-07 LAB — CREATININE, SERUM
Creatinine, Ser: 1.05 mg/dL — ABNORMAL HIGH (ref 0.44–1.00)
GFR calc Af Amer: >60 mL/min (ref >60)
GFR calc non Af Amer: >60 mL/min (ref >60)

## 2017-10-07 LAB — CULTURE, BLOOD (SINGLE)
Culture: NO GROWTH
SPECIAL REQUESTS: ADEQUATE

## 2017-10-07 LAB — GLUCOSE, CAPILLARY: Glucose-Capillary: 86 mg/dL (ref 65–99)

## 2017-10-07 LAB — AEROBIC/ANAEROBIC CULTURE (SURGICAL/DEEP WOUND): GRAM STAIN: NONE SEEN

## 2017-10-07 NOTE — Discharge Instructions (Signed)

## 2017-10-07 NOTE — Discharge Summary (Signed)
[place] [facility] [place] NAME: Charlene Jackson    MR#:  [id number]  DATE OF BIRTH:  1970-12-30  DATE OF ADMISSION:  09/30/2017 ADMITTING PHYSICIAN: Fritzi Mandes, MD  DATE OF DISCHARGE: 10/07/2017  PRIMARY CARE PHYSICIAN: Patient, No Pcp Per    ADMISSION DIAGNOSIS:  Cellulitis of right lower extremity [K48.185]  DISCHARGE DIAGNOSIS:  Sepsis on admission Right calcaneus Osteomyelitis Bilateral LE lymphedema with stasis dermatitis Right dental caries/infection SECONDARY DIAGNOSIS:  History reviewed. No pertinent past medical history.  HOSPITAL COURSE:   LisaFergusonis a47 y.o.femalewith past medical history of stasis dermatitis both lower extremity presents to the emergency room department for worsening right foot pain and worsening bilateral lower extremity swelling with fungating skin infection along with significant amount of drainage from the right heel. Patient says at baseline her legs have been swollen with black/brown scaly skin that he relates on her feet falls off on its own. She used to follow-up with a dermatologist however has lost to do so in many months.  1. Sepsis secondary to bilateral lower extremity cellulitis, right heel osteomyelitis  significant wound infection foul-smelling right foot more than the left -Patient had severe amount of drainage from the right heel. -Podiaty, and vascular consultation appreicated -Wound consult noted -X-ray both feet no evidence of osteomyelitis noted on plain x-ray film- MRI confirmed OM. - Initial debridement done by podiatry. -Vascular performed angiogram to check for circulation on the right lower extremity and it was found satisfactory 10/01/17. - Further debridement and some scrapping of bone done by podiatry on 10/02/2017 - ID consult to guide IV antibiotic therapy. - Suggest IV Unasyn for total 4 weeks - stop date 10/30/2017. - s/p PICC line , and arrangements for out pt  abx, home health RN for dressing changes and abx administration.  2.Hyperglycemia A1c- 5.4  3.Patient states she has history of thyroid problem - normal TSH  4. DVT prophylaxis subcu Lovenox  5. Iron deficiency anemia Cont  oral iron.   Give IV iron 1 dose.  6. Right facial swelling   ENT consult.-appreciated. Pt has dental caries and recommend see dentist soon after d/c  7. Generalized weakness   Physical therapy evaluation suggested SNF placement, but due to her insurance issues and no coverage she is not a candidate to go to rehabilitation.   D/c to home today CONSULTS OBTAINED:  Treatment Team:  Leonel Ramsay, MD Sharlotte Alamo, DPM Carloyn Manner, MD  DRUG ALLERGIES:  No Known Allergies  DISCHARGE MEDICATIONS:   Allergies as of 10/07/2017   No Known Allergies     Medication List    TAKE these medications   ampicillin-sulbactam IVPB Commonly known as:  UNASYN Inject 3 g into the vein every 6 (six) hours for 24 days. Indication:  osteomyleitis Last Day of Therapy:  10/30/17 Labs - Once weekly:  CBC/D and BMP, Labs - Every other week:  ESR and CRP   ascorbic acid 250 MG tablet Commonly known as:  VITAMIN C Take 1 tablet (250 mg total) by mouth 2 (two) times daily.   ferrous sulfate 325 (65 FE) MG tablet Take 1 tablet (325 mg total) by mouth 2 (two) times daily with a meal.   oxyCODONE-acetaminophen 5-325 MG tablet Commonly known as:  PERCOCET/ROXICET Take 1-2 tablets by mouth every 6 (six) hours as needed for severe pain.   polyethylene glycol packet Commonly known as:  MIRALAX / GLYCOLAX Take 17 g by mouth daily as needed for  mild constipation.            Home Infusion Instuctions  (From admission, onward)        Start     Ordered   10/06/17 0000  Home infusion instructions [facility] May follow [facility] Dosing Protocol; May administer Cathflo as needed to maintain patency of vascular access device.; [place] of  vascular access device: per [facility] Protocol: 0.9% NaCl pre/post medica...    Question Answer Comment  Instructions May follow [facility] Dosing Protocol   Instructions May administer Cathflo as needed to maintain patency of vascular access device.   Instructions [place] of vascular access device: per [facility] Protocol: 0.9% NaCl pre/post medication administration and prn patency; Heparin 100 u/ml, 19m for implanted ports and Heparin 10u/ml, 521mfor all other central venous catheters.   Instructions May follow [facility] Anaphylaxis Protocol for First Dose Administration in the home: 0.9% NaCl at 25-50 ml/hr to maintain IV access for protocol meds. Epinephrine 0.3 ml IV/IM PRN and Benadryl 25-50 IV/IM PRN s/s of anaphylaxis.   Instructions [facility] Infusion Coordinator (RN) to assist per patient IV care needs in the home PRN.      10/06/17 [place](From admission, onward)        Start     Ordered   10/06/17 1329  For home use only DME Bedside commode  Once    Question:  Patient needs a bedside commode to treat with the following condition  Answer:  Weakness   10/06/17 1328   10/06/17 1329  For home use only DME Walker rolling  Once    Question:  Patient needs a walker to treat with the following condition  Answer:  Weakness   10/06/17 1328      If you experience worsening of your admission symptoms, develop shortness of breath, life threatening emergency, suicidal or homicidal thoughts you must seek medical attention immediately by calling 911 or calling your MD immediately  if symptoms less severe.  You Must read complete instructions/literature along with all the possible adverse reactions/side effects for all the Medicines you take and that have been prescribed to you. Take any new Medicines after you have completely understood and accept all the possible adverse reactions/side effects.   Please note  You were cared for by a hospitalist during your  hospital stay. If you have any questions about your discharge medications or the care you received while you were in the hospital after you are discharged, you can call the unit and asked to speak with the hospitalist on call if the hospitalist that took care of you is not available. Once you are discharged, your primary care physician will handle any further medical issues. Please note that NO REFILLS for any discharge medications will be authorized once you are discharged, as it is imperative that you return to your primary care physician (or establish a relationship with a primary care physician if you do not have one) for your aftercare needs so that they can reassess your need for medications and monitor your lab values. Today   SUBJECTIVE   No new complaints VITAL SIGNS:  Blood pressure (!) 125/53, pulse 88, temperature 97.8 F (36.6 C), temperature source Oral, resp. rate 18, height 5' 7"  (1.702 m), weight 127 kg (280 lb), last menstrual period 10/01/2017, SpO2 100 %.  I/O:    Intake/Output Summary (Last 24 hours) at 10/07/2017 0708 Last data filed at 10/07/2017  0000 Gross per 24 hour  Intake 546 ml  Output -  Net 546 ml    PHYSICAL EXAMINATION:  GENERAL:  47 y.o.-year-old patient lying in the bed with no acute distress. obese EYES: Pupils equal, round, reactive to light and accommodation. No scleral icterus. Extraocular muscles intact.  HEENT: Head atraumatic, normocephalic. Oropharynx and nasopharynx clear.  NECK:  Supple, no jugular venous distention. No thyroid enlargement, no tenderness.  LUNGS: Normal breath sounds bilaterally, no wheezing, rales,rhonchi or crepitation. No use of accessory muscles of respiration.  CARDIOVASCULAR: S1, S2 normal. No murmurs, rubs, or gallops.  ABDOMEN: Soft, non-tender, non-distended. Bowel sounds present. No organomegaly or mass.  EXTREMITIES chronic  edema, no cyanosis, or clubbing. Dressing+ right foot NEUROLOGIC: Cranial nerves II through XII  are intact. Muscle strength 5/5 in all extremities. Sensation intact. Gait not checked.  PSYCHIATRIC: The patient is alert and oriented x 3.  SKIN: No obvious rash, lesion, or ulcer.   DATA REVIEW:   CBC  Recent Labs  Lab 10/05/17 0504  WBC 8.3  HGB 7.6*  HCT 24.3*  PLT 274    Chemistries  Recent Labs  Lab 09/30/17 1658  10/05/17 0504 10/07/17 0504  NA 135   < > 140  --   K 4.0   < > 3.6  --   CL 98*   < > 104  --   CO2 28   < > 29  --   GLUCOSE 144*   < > 100*  --   BUN 22*   < > 13  --   CREATININE 0.81   < > 0.95 1.05*  CALCIUM 9.6   < > 8.7*  --   AST 45*  --   --   --   ALT 35  --   --   --   ALKPHOS 197*  --   --   --   BILITOT 0.3  --   --   --    < > = values in this interval not displayed.    Microbiology Results   Recent Results (from the past 240 hour(s))  Blood culture (routine x 2)     Status: Abnormal   Collection Time: 09/30/17  5:32 PM  Result Value Ref Range Status   Specimen Description   Final    BLOOD BLOOD LEFT HAND Performed at [facility], [address] [place], [place] [address]    Special Requests   Final    BOTTLES DRAWN AEROBIC AND ANAEROBIC Blood Culture results may not be optimal due to an excessive volume of blood received in culture bottles Performed at [facility], [address] [place], [place] [address]    Culture  Setup Time   Final    GRAM POSITIVE COCCI IN BOTH AEROBIC AND ANAEROBIC BOTTLES CRITICAL RESULT CALLED TO, READ BACK BY AND VERIFIED WITH: NATE COOKSON '@1359'$  10/01/17 [facility]    Culture (A)  Final    STAPHYLOCOCCUS EPIDERMIDIS THE SIGNIFICANCE OF ISOLATING THIS ORGANISM FROM A SINGLE SET OF BLOOD CULTURES WHEN MULTIPLE SETS ARE DRAWN IS UNCERTAIN. PLEASE NOTIFY THE MICROBIOLOGY DEPARTMENT WITHIN ONE WEEK IF SPECIATION AND SENSITIVITIES ARE REQUIRED. STAPHYLOCOCCUS CAPITIS    Report Status 10/04/2017 FINAL  Final   Organism ID, Bacteria STAPHYLOCOCCUS CAPITIS  Final      Susceptibility    Staphylococcus capitis - MIC*    CIPROFLOXACIN <=0.5 SENSITIVE Sensitive     ERYTHROMYCIN <=0.25 SENSITIVE Sensitive     GENTAMICIN <=0.5  SENSITIVE Sensitive     OXACILLIN <=0.25 SENSITIVE Sensitive     TETRACYCLINE <=1 SENSITIVE Sensitive     VANCOMYCIN <=0.5 SENSITIVE Sensitive     TRIMETH/SULFA <=10 SENSITIVE Sensitive     CLINDAMYCIN <=0.25 SENSITIVE Sensitive     RIFAMPIN <=0.5 SENSITIVE Sensitive     Inducible Clindamycin NEGATIVE Sensitive     * STAPHYLOCOCCUS CAPITIS  Blood Culture ID Panel (Reflexed)     Status: Abnormal   Collection Time: 09/30/17  5:32 PM  Result Value Ref Range Status   Enterococcus species NOT DETECTED NOT DETECTED Final   Listeria monocytogenes NOT DETECTED NOT DETECTED Final   Staphylococcus species DETECTED (A) NOT DETECTED Final    Comment: Methicillin (oxacillin) susceptible coagulase negative staphylococcus. Possible blood culture contaminant (unless isolated from more than one blood culture draw or clinical case suggests pathogenicity). No antibiotic treatment is indicated for blood  culture contaminants. CRITICAL RESULT CALLED TO, READ BACK BY AND VERIFIED WITH: Agency '@1359'$  10/01/17 [facility]    Staphylococcus aureus NOT DETECTED NOT DETECTED Final   Methicillin resistance NOT DETECTED NOT DETECTED Final   Streptococcus species NOT DETECTED NOT DETECTED Final   Streptococcus agalactiae NOT DETECTED NOT DETECTED Final   Streptococcus pneumoniae NOT DETECTED NOT DETECTED Final   Streptococcus pyogenes NOT DETECTED NOT DETECTED Final   Acinetobacter baumannii NOT DETECTED NOT DETECTED Final   Enterobacteriaceae species NOT DETECTED NOT DETECTED Final   Enterobacter cloacae complex NOT DETECTED NOT DETECTED Final   Escherichia coli NOT DETECTED NOT DETECTED Final   Klebsiella oxytoca NOT DETECTED NOT DETECTED Final   Klebsiella pneumoniae NOT DETECTED NOT DETECTED Final   Proteus species NOT DETECTED NOT DETECTED Final   Serratia marcescens NOT  DETECTED NOT DETECTED Final   Haemophilus influenzae NOT DETECTED NOT DETECTED Final   Neisseria meningitidis NOT DETECTED NOT DETECTED Final   Pseudomonas aeruginosa NOT DETECTED NOT DETECTED Final   Candida albicans NOT DETECTED NOT DETECTED Final   Candida glabrata NOT DETECTED NOT DETECTED Final   Candida krusei NOT DETECTED NOT DETECTED Final   Candida parapsilosis NOT DETECTED NOT DETECTED Final   Candida tropicalis NOT DETECTED NOT DETECTED Final    Comment: Performed at [facility], [address] [place], [place] [address]  Blood culture (routine x 2)     Status: Abnormal   Collection Time: 09/30/17  5:33 PM  Result Value Ref Range Status   Specimen Description BLOOD RIGHT ANTECUBITAL  Final   Special Requests   Final    BOTTLES DRAWN AEROBIC AND ANAEROBIC Blood Culture adequate volume Performed at [facility], [address] [place], [place] [address]    Culture  Setup Time   Final    GRAM POSITIVE COCCI AEROBIC BOTTLE ONLY CRITICAL VALUE NOTED.  VALUE IS CONSISTENT WITH PREVIOUSLY REPORTED AND CALLED VALUE.    Culture (A)  Final    STAPHYLOCOCCUS CAPITIS SUSCEPTIBILITIES PERFORMED ON PREVIOUS CULTURE WITHIN THE LAST 5 DAYS. Performed at [facility], [place] [address] [place], [place] [address]    Report Status 10/04/2017 FINAL  Final  Aerobic/Anaerobic Culture (surgical/deep wound)     Status: None   Collection Time: 09/30/17 11:18 PM  Result Value Ref Range Status   Specimen Description   Final    FOOT Performed at [facility], [address], [place], [place] [address]    Special Requests   Final    NONE Performed at [facility], [place], [place]  [address]    Gram Stain   Final    MODERATE WBC PRESENT, PREDOMINANTLY PMN ABUNDANT GRAM POSITIVE COCCI MODERATE GRAM NEGATIVE RODS Performed at [facility], [place] [address] [place], [place] [address]    Culture   Final    MODERATE PROTEUS  MIRABILIS MODERATE ENTEROCOCCUS FAECALIS MIXED ANAEROBIC FLORA PRESENT.  CALL LAB IF FURTHER IID REQUIRED.    Report Status 10/06/2017 FINAL  Final   Organism ID, Bacteria PROTEUS MIRABILIS  Final   Organism ID, Bacteria ENTEROCOCCUS FAECALIS  Final      Susceptibility   Enterococcus faecalis - MIC*    AMPICILLIN <=2 SENSITIVE Sensitive     VANCOMYCIN 1 SENSITIVE Sensitive     GENTAMICIN SYNERGY SENSITIVE Sensitive     * MODERATE ENTEROCOCCUS FAECALIS   Proteus mirabilis - MIC*    AMPICILLIN <=2 SENSITIVE Sensitive     CEFAZOLIN <=4 SENSITIVE Sensitive     CEFEPIME <=1 SENSITIVE Sensitive     CEFTAZIDIME <=1 SENSITIVE Sensitive     CEFTRIAXONE <=1 SENSITIVE Sensitive     CIPROFLOXACIN <=0.25 SENSITIVE Sensitive     GENTAMICIN <=1 SENSITIVE Sensitive     IMIPENEM <=0.25 SENSITIVE Sensitive     TRIMETH/SULFA <=20 SENSITIVE Sensitive     AMPICILLIN/SULBACTAM <=2 SENSITIVE Sensitive     PIP/TAZO <=4 SENSITIVE Sensitive     * MODERATE PROTEUS MIRABILIS  Urine culture     Status: None   Collection Time: 10/01/17  6:30 AM  Result Value Ref Range Status   Specimen Description   Final    URINE, RANDOM Performed at [facility], [address], [place], [place] [address]    Special Requests   Final    NONE Performed at [facility], [address], [place], [place] [address]    Culture   Final    NO GROWTH Performed at [facility], [place] [address] [place], [place] [address]    Report Status 10/02/2017 FINAL  Final  Aerobic/Anaerobic Culture (surgical/deep wound)     Status: None (Preliminary result)   Collection Time: 10/02/17  8:43 AM  Result Value Ref Range Status   Specimen Description   Final    BONE HEEL OF FOOT RIGHT Performed at [facility], [address] [place], [place] [address]    Special Requests   Final    NONE Performed at [facility], [address] [place], [place] [address]    Gram Stain   Final    NO WBC  SEEN NO ORGANISMS SEEN Performed at [facility], [place] [address], [place],  [address]    Culture   Final    RARE STAPHYLOCOCCUS SPECIES (COAGULASE NEGATIVE) RARE PROTEUS MIRABILIS CRITICAL RESULT CALLED TO, READ BACK BY AND VERIFIED WITH: J RODRIGUEZ,RN AT 1452 10/03/17 BY L BENFIELD CONCERNING GROWTH ON CULTURE NO ANAEROBES ISOLATED; CULTURE IN PROGRESS FOR 5 DAYS    Report Status PENDING  Incomplete   Organism ID, Bacteria STAPHYLOCOCCUS SPECIES (COAGULASE NEGATIVE)  Final   Organism ID, Bacteria PROTEUS MIRABILIS  Final      Susceptibility   Proteus mirabilis - MIC*    AMPICILLIN <=2 SENSITIVE Sensitive     CEFAZOLIN <=4 SENSITIVE Sensitive     CEFEPIME <=1 SENSITIVE Sensitive     CEFTAZIDIME <=1 SENSITIVE Sensitive     CEFTRIAXONE <=1 SENSITIVE Sensitive     CIPROFLOXACIN <=0.25 SENSITIVE Sensitive     GENTAMICIN <=1 SENSITIVE Sensitive     IMIPENEM <=  0.25 SENSITIVE Sensitive     TRIMETH/SULFA <=20 SENSITIVE Sensitive     AMPICILLIN/SULBACTAM <=2 SENSITIVE Sensitive     PIP/TAZO <=4 SENSITIVE Sensitive     * RARE PROTEUS MIRABILIS   Staphylococcus species (coagulase negative) - MIC*    CIPROFLOXACIN <=0.5 SENSITIVE Sensitive     ERYTHROMYCIN <=0.25 SENSITIVE Sensitive     GENTAMICIN <=0.5 SENSITIVE Sensitive     OXACILLIN RESISTANT Resistant     TETRACYCLINE <=1 SENSITIVE Sensitive     VANCOMYCIN 1 SENSITIVE Sensitive     TRIMETH/SULFA <=10 SENSITIVE Sensitive     CLINDAMYCIN <=0.25 SENSITIVE Sensitive     RIFAMPIN <=0.5 SENSITIVE Sensitive     Inducible Clindamycin NEGATIVE Sensitive     * RARE STAPHYLOCOCCUS SPECIES (COAGULASE NEGATIVE)  Culture, blood (single) w Reflex to ID Panel     Status: None (Preliminary result)   Collection Time: 10/02/17  8:02 PM  Result Value Ref Range Status   Specimen Description BLOOD LEFT FOREARM  Final   Special Requests   Final    BOTTLES DRAWN AEROBIC AND ANAEROBIC Blood Culture adequate volume   Culture   Final    NO  GROWTH 4 DAYS Performed at [facility], [address], [place], [place] [address]    Report Status PENDING  Incomplete    RADIOLOGY:  No results found.   Management plans discussed with the patient, family and they are in agreement.  CODE STATUS:     Code Status Orders  (From admission, onward)        Start     Ordered   09/30/17 2025  Full code  Continuous     09/30/17 2025    Code Status History    Date Active Date Inactive Code Status Order ID Comments User Context   This patient has a current code status but no historical code status.      TOTAL TIME TAKING CARE OF THIS PATIENT: 40 minutes.    Fritzi Mandes M.D on 10/07/2017 at 7:08 AM  Between 7am to 6pm - Pager - [phone] After 6pm go to [email or web address] - password [email or web address] [facility]  Office  [phone]  CC: Primary care physician; Patient, No Pcp Per

## 2017-10-07 NOTE — Care Management Note (Signed)
Case Management Note  Patient Details  Name: Charlene PiesLisa R Jackson MRN: [id number] Date of Birth: 1971-09-28  Subjective/Objective:   Discharging today                 Action/Plan: Advanced notified of discharge.   Expected Discharge Date:  10/07/17               Expected Discharge Plan:     In-House Referral:     Discharge planning Services     Post Acute Care Choice:    Choice offered to:     DME Arranged:    DME Agency:     HH Arranged:    HH Agency:     Status of Service:     If discussed at [employer] Meetings, dates discussed:    Additional Comments:  Charlene MemosLisa M Ajahnae Rathgeber, RN 10/07/2017, 8:45 AM

## 2018-06-23 IMAGING — MR MR ANKLE*R* W/O CM
4 of 6 series · 24 of 40 positions shown · non-contrast
Comparison: None.

CLINICAL DATA: History of chronic stasis dermatitis. The patient
developed a sore on the right heel 1 month ago with pain and
drainage.

EXAM:
MRI OF THE RIGHT ANKLE WITHOUT CONTRAST
TECHNIQUE: Multiplanar, multisequence MR imaging of the ankle was performed. No
intravenous contrast was administered.

[Series 7: T1 · axial · 3.5mm · 0.56mm/px · z∈[-118,+46]mm · 6 of 40 slices shown (1 of 2)]
[im 1/40]
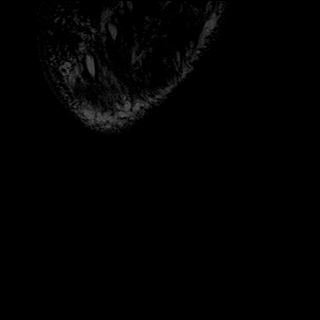
[im 8/40]
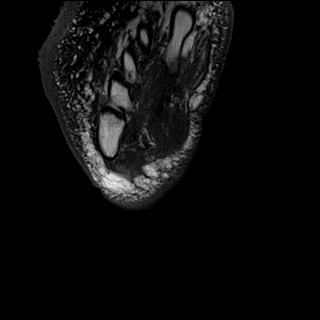
[im 16/40]
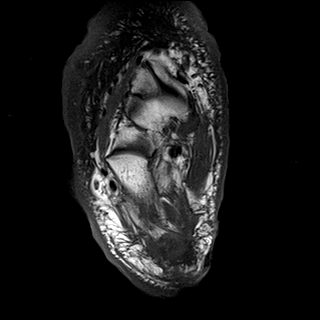
[im 24/40]
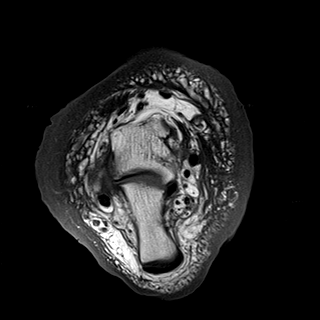
[im 32/40]
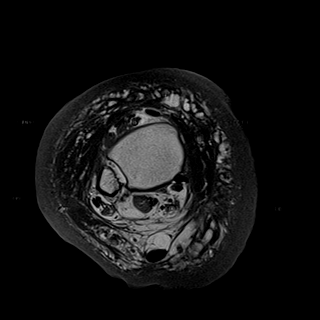
[im 40/40]
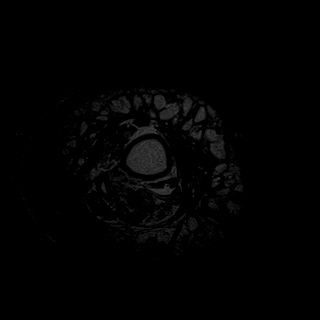

[Series 8: T2 fat-sat · axial · 3.5mm · 0.35mm/px · z∈[-118,+46]mm · 7 of 40 slices shown]
[im 1/40]
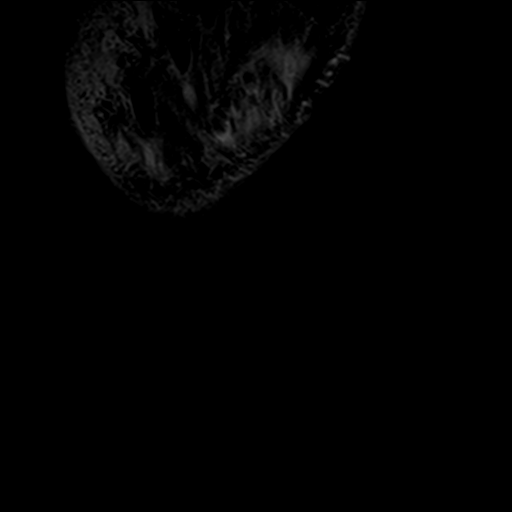
[im 7/40]
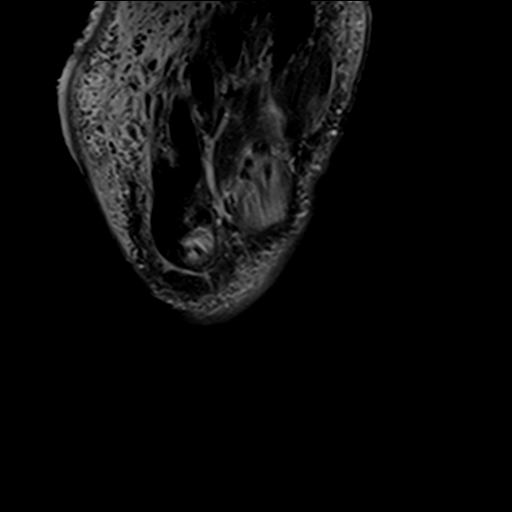
[im 14/40]
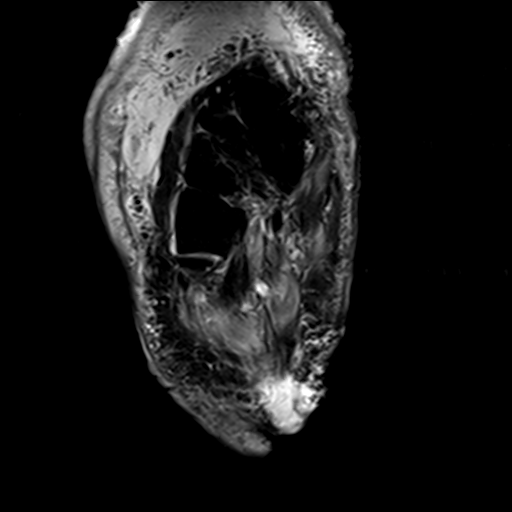
[im 20/40]
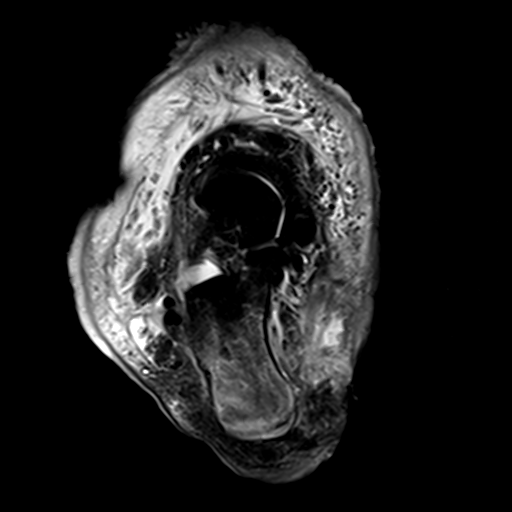
[im 27/40]
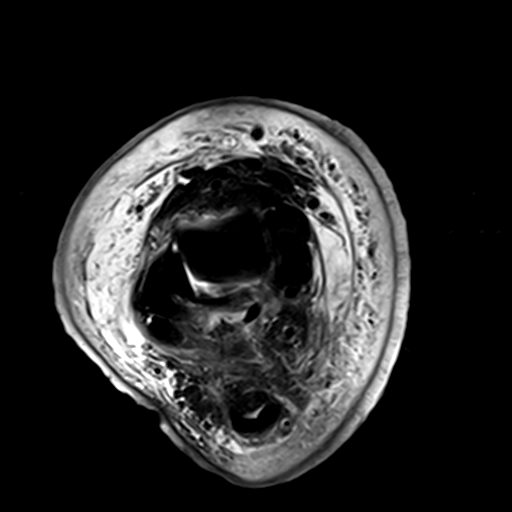
[im 33/40]
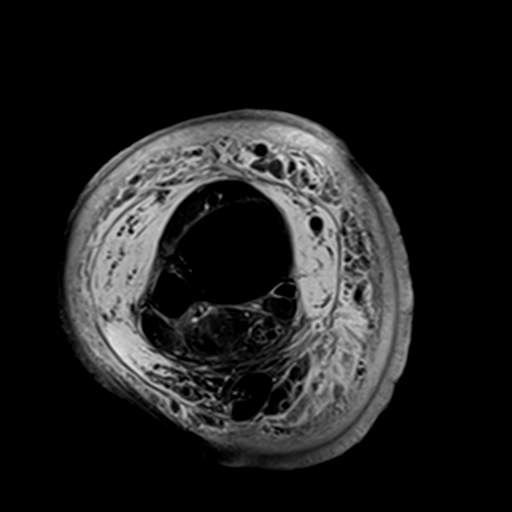
[im 40/40]
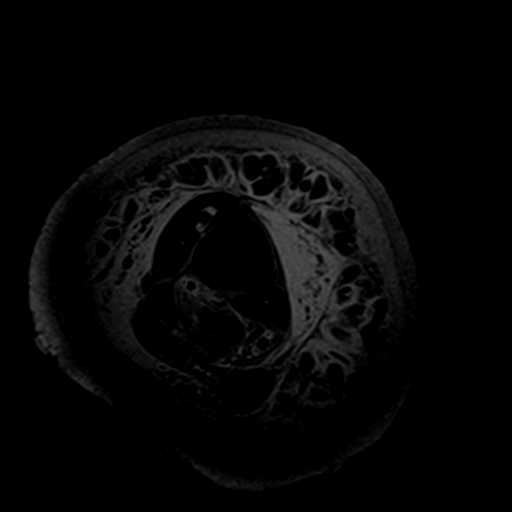

[Series 9: T1 · oblique · 3.5mm · 0.39mm/px · 7 of 40 slices shown (2 of 2)]
[im 1/40]
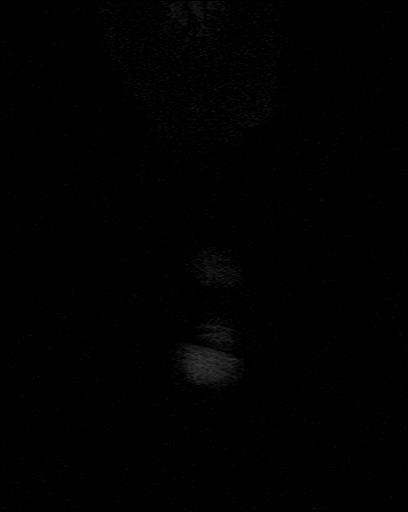
[im 7/40]
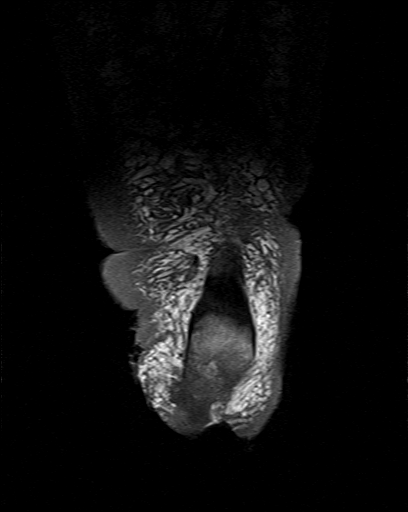
[im 14/40]
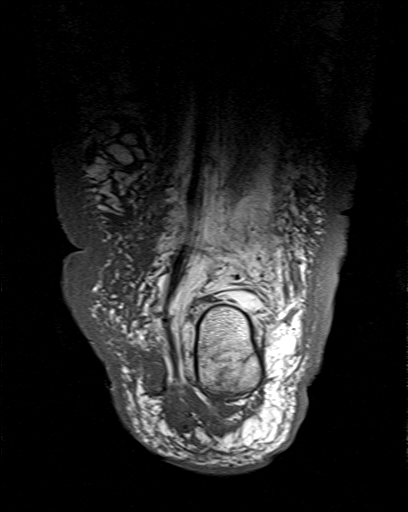
[im 20/40]
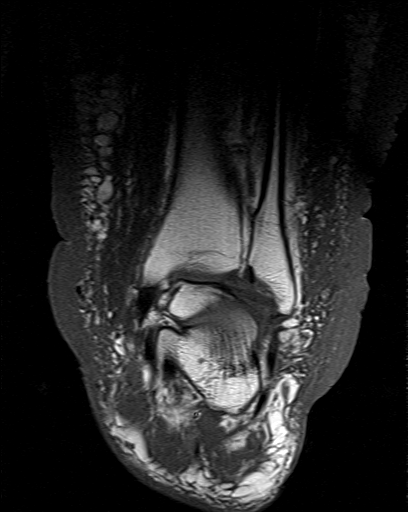
[im 27/40]
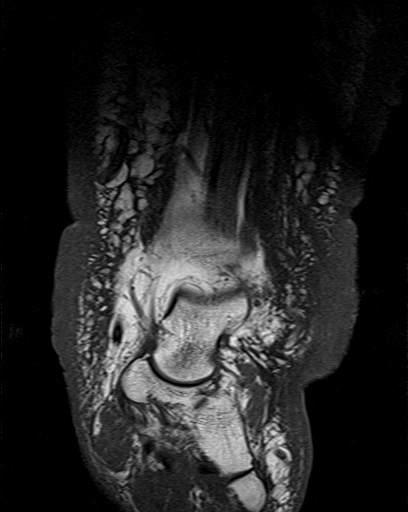
[im 33/40]
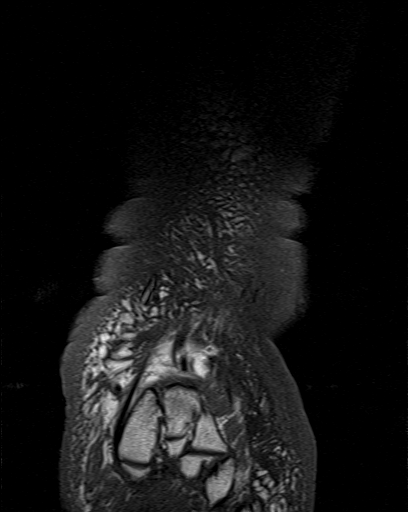
[im 40/40]
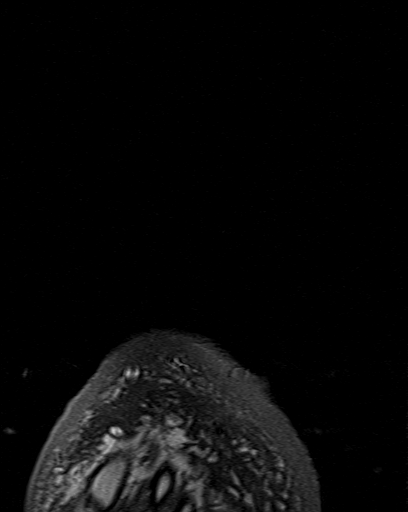

[Series 10: STIR · oblique · 3.5mm · 0.39mm/px · 4 of 40 slices shown]
[im 1/40]
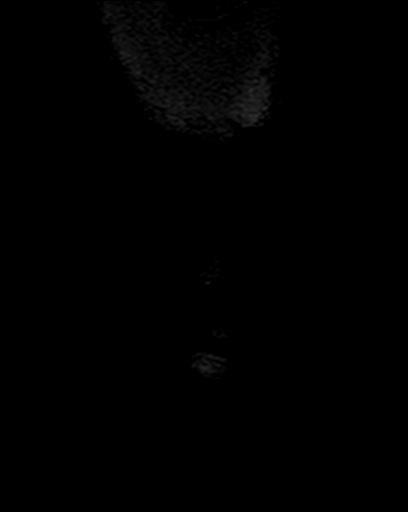
[im 7/40]
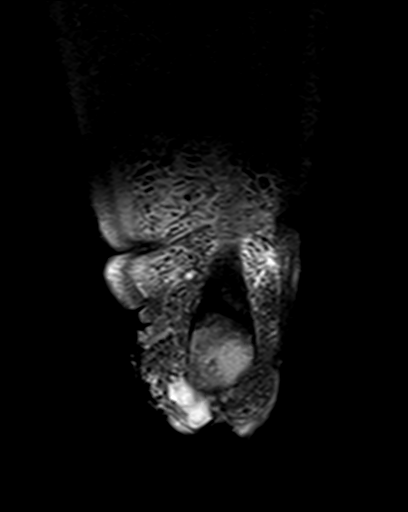
[im 20/40]
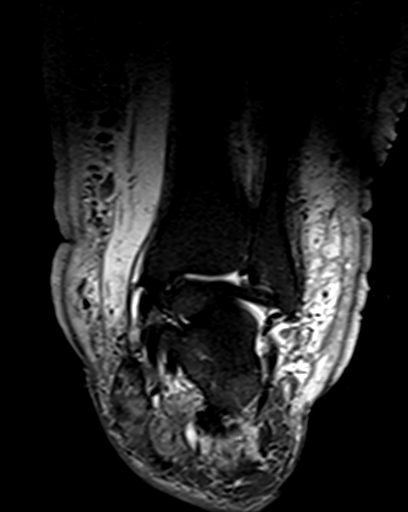
[im 33/40]
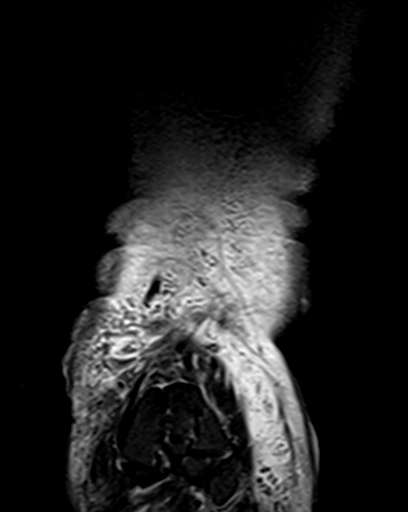

[24 of 40 positions shown; findings below may reference images not displayed]

FINDINGS: TENDONS

Peroneal: Intact.

Posteromedial: Intact.

Anterior: Intact.

Achilles: Intact.

Plantar Fascia: Thickening and edema are seen in the proximal 2 cm
of both the medial and lateral cords. The plantar fascia is intact.

LIGAMENTS

Lateral: Intact.

Medial: Intact.

CARTILAGE

Ankle Joint: Negative.

Subtalar Joints/Sinus Tarsi: Negative.

Bones: There is intense marrow edema in the dorsal 4 cm of the
calcaneus which is worst subjacent to a skin ulceration on the heel.
No fracture is identified. T1 and T2 hypointense line in the
posterior, inferior calcaneus likely represents a growth arrest line
or physeal plate.

Other: Intense subcutaneous edema is present about the imaged lower
leg, ankle and foot. A skin wound is seen on the plantar surface of
the foot. Deep and medial to the wound, there is focal fluid
measuring 2.1 cm AP by 1.1 cm craniocaudal by 2.0 cm craniocaudal
compatible with phlegmon or abscess.
IMPRESSION: Skin ulceration on the heel with osteomyelitis in the underlying
calcaneus as described above. Focal fluid collection deep and medial
to the skin wound is consistent with phlegmon or abscess. Edema in
the proximal 2 cm of the medial and lateral cords of the plantar
fascia is worrisome for infection.

Intense subcutaneous edema diffusely about the imaged lower leg,
ankle and foot is consistent with dependent change and/or
cellulitis.

Negative for septic joint.
